# Patient Record
Sex: Male | Born: 1950 | Race: White | Hispanic: No | Smoking: Never smoker
Health system: Southern US, Community
[De-identification: ages and names within clinical notes are randomized; demographics above are authoritative.]

## PROBLEM LIST (undated history)

## (undated) DIAGNOSIS — I10 Essential (primary) hypertension: Secondary | ICD-10-CM

## (undated) DIAGNOSIS — G4730 Sleep apnea, unspecified: Secondary | ICD-10-CM

## (undated) DIAGNOSIS — M1710 Unilateral primary osteoarthritis, unspecified knee: Secondary | ICD-10-CM

## (undated) DIAGNOSIS — E785 Hyperlipidemia, unspecified: Secondary | ICD-10-CM

## (undated) DIAGNOSIS — A6000 Herpesviral infection of urogenital system, unspecified: Secondary | ICD-10-CM

## (undated) DIAGNOSIS — M179 Osteoarthritis of knee, unspecified: Secondary | ICD-10-CM

## (undated) HISTORY — DX: Herpesviral infection of urogenital system, unspecified: A60.00

## (undated) HISTORY — DX: Hyperlipidemia, unspecified: E78.5

## (undated) HISTORY — DX: Osteoarthritis of knee, unspecified: M17.9

## (undated) HISTORY — DX: Unilateral primary osteoarthritis, unspecified knee: M17.10

## (undated) HISTORY — DX: Essential (primary) hypertension: I10

## (undated) HISTORY — DX: Sleep apnea, unspecified: G47.30

---

## 1998-09-10 DIAGNOSIS — G4733 Obstructive sleep apnea (adult) (pediatric): Secondary | ICD-10-CM

## 1998-09-10 HISTORY — DX: Obstructive sleep apnea (adult) (pediatric): G47.33

## 1999-05-21 ENCOUNTER — Ambulatory Visit: Admission: RE | Admit: 1999-05-21 | Discharge: 1999-05-21 | Payer: Self-pay | Admitting: *Deleted

## 2005-09-10 DIAGNOSIS — M47812 Spondylosis without myelopathy or radiculopathy, cervical region: Secondary | ICD-10-CM

## 2005-09-10 HISTORY — PX: CERVICAL SPINE SURGERY: SHX589

## 2005-09-10 HISTORY — DX: Spondylosis without myelopathy or radiculopathy, cervical region: M47.812

## 2006-07-21 ENCOUNTER — Ambulatory Visit: Payer: Self-pay | Admitting: Cardiovascular Disease

## 2006-07-21 ENCOUNTER — Inpatient Hospital Stay (HOSPITAL_COMMUNITY): Admission: EM | Admit: 2006-07-21 | Discharge: 2006-07-23 | Payer: Self-pay | Admitting: Emergency Medicine

## 2006-07-22 ENCOUNTER — Encounter: Payer: Self-pay | Admitting: Cardiology

## 2009-03-02 ENCOUNTER — Ambulatory Visit (HOSPITAL_BASED_OUTPATIENT_CLINIC_OR_DEPARTMENT_OTHER): Admission: RE | Admit: 2009-03-02 | Discharge: 2009-03-02 | Payer: Self-pay | Admitting: Family Medicine

## 2009-03-05 ENCOUNTER — Ambulatory Visit: Payer: Self-pay | Admitting: Internal Medicine

## 2011-01-23 NOTE — Procedures (Signed)
[name], [name]                ACCOUNT NO.:  [id number]   MEDICAL RECORD NO.:  [id number]          PATIENT TYPE:  OUT   LOCATION:  SLEEP CENTER                 FACILITY:  [facility]   PHYSICIAN:  Clinton D. Maple Hudson, MD, FCCP, FACPDATE OF BIRTH:  19-Nov-1951   DATE OF STUDY:  03/02/2009                            NOCTURNAL POLYSOMNOGRAM   REFERRING PHYSICIAN:  Marvis Repress   INDICATION FOR STUDY:  Hypersomnia with sleep apnea.  Epworth sleepiness  score 0/24.  BMI 34.3.  Weight 260 pounds, height 73 inches.  Neck 18.5  inches.  Home medication charted and reviewed.  A baseline diagnostic  NPSG on May 12, 1999, recorded an RDI of 60 per hour with split  protocol CPAP titration to 7 CWP, RDI 0 per hour.  CPAP titration is now  requested.   SLEEP ARCHITECTURE:  Total sleep time 225 minutes with sleep efficiency  58.2%.  Stage I was 21.1%, stage II 68%, stage III absent, REM 10.9% of  total sleep time.  Sleep latency 14.5 minutes, REM latency 206 minutes,  awake after sleep onset 147 minutes, arousal index 26.1.  Sleep  architecture notable for difficulty sustaining sleep until 1:00 a.m.  Prior to that, there were repeated brief sleep intervals, but he was  mostly awake from lights out at 22:35 p.m.  No bedtime medication was  taken.   RESPIRATORY DATA:  CPAP titration protocol.  CPAP was titrated to 11  CWP, AHI 1.6 per hour.  He wore a large ResMed Activa nasal mask with  heated humidifier.   OXYGEN DATA:  Minimal to no snoring with CPAP.  Mean oxygen saturation  on room air with CPAP was 94.3%.   CARDIAC DATA:  Sinus rhythm with occasional PVC.   MOVEMENT/PARASOMNIA:  No significant movement disturbance.  No bathroom  trips.   IMPRESSION/RECOMMENDATIONS:  1. Successful continuous positive airway pressure to 11 CWP, AHI 1.6      per hour.  He wore a large ResMed Activa nasal mask with heated      humidifier.  2. Baseline diagnostic nocturnal polysomnogram on May 12, 1999,      had recorded an RDI of 60 per hour with split study continuous      positive airway pressure to 7 CWP, RDI 0 per hour at that time.      Clinton D. Maple Hudson, MD, [facility], FACP  Diplomate, Biomedical engineer of Sleep Medicine  Electronically Signed    CDY/MEDQ  D:  03/05/2009 12:13:38  T:  03/05/2009 20:53:16  Job:  [id number]

## 2011-01-26 NOTE — H&P (Signed)
Ryan [name], Ryan [name]                ACCOUNT NO.:  [id number]   MEDICAL RECORD NO.:  [id number]          PATIENT TYPE:  EMS   LOCATION:  MAJO                         FACILITY:  [facility]   PHYSICIAN:  Theresia Bough, MD       DATE OF BIRTH:  02/01/1951   DATE OF ADMISSION:  07/20/2006  DATE OF DISCHARGE:                                HISTORY & PHYSICAL   PRIMARY CARE PHYSICIAN:  Patient is not assigned.   PRESENTING COMPLAINT:  Chest pain.   HISTORY OF PRESENT ILLNESS:  This is a 60 year old white male patient who  came into the emergency room because of chest pain.  The pain started about  3 days ago.  The pain started around his neck, left side of the neck.  The  pain refers to his left shoulder, his left chest and his left arm and  forearm.  Patient has been taking ibuprofen for the pain.  The patient is  relieved by ibuprofen.  About sometime this afternoon, the patient developed  nausea and that was why he came to the hospital.  He denies any history of  heart disease.  He denies any headache.  No dizziness.  No blurring of  vision.  He denies cough.  No wheezing.  No hemoptysis.  He has nausea.  He  denies vomiting and he denies diarrhea.  Denies abdominal pain.  He denies  any leg swelling.  There is no foot edema.  The patient denies any dysuria.   PAST MEDICAL HISTORY:  A past medical history of sleep apnea.  The patient  uses CPAP to sleep at home.   SOCIAL HISTORY:  Denies any smoking.  He drinks alcohol socially.   FAMILY HISTORY:  Noncontributory.   MEDICATIONS:  Patient is not on any medications.   ALLERGIES:  HE DENIES ANY KNOWN ALLERGIES.   PHYSICAL EXAMINATION:  VITAL SIGNS:  Blood pressure 171/104.  Pulse rate is  100.  Respirations are 22.  HEENT:  Shows pink conjunctivae.  He has no jaundice.  His mucous membranes  are moist.  NECK:  Supple.  CHEST:  There is no chest wall tenderness.  Auscultation shows clear breath  sounds.  CARDIOVASCULAR:  Shows normal  heart sounds with no murmur and no gallop.  Pulses are palpable in his legs.  ABDOMEN:  Soft, nontender and no masses palpable.  Patient has normal bowel  sounds.  EXTREMITIES:  Show no edema.  No cyanosis.  Patient has normal range of  motion in his joints.  CENTRAL NERVOUS SYSTEM:  Patient is alert and oriented to time place and  person.  There is no focal deficit.  Power is 4/4 in all limbs.  His  sensation is intact.   INITIAL LABORATORIES:  Sodium 136, potassium 4.2, chloride of 103, bicarb of  28, BUN of 10, creatinine of 1.3 and a glucose of 184.  Initial myoglobin is  397, CK-MB was 7.1, troponin I 0.05, D-dimer is negative.  Second myoglobin  is greater than 500, CK-MB is 7.5, troponin is less than 0.,05.  His chest x-  ray is negative.  His EKG shows sinus tachycardia.  There is no diagnostic  ST/T changes.   ASSESSMENT:  1. Chest pain, rule out acute coronary syndrome.  2. History of sleep apnea.   PLAN:  To admit patient to telemetry observation.  I am going to continue  cardiac enzymes protocol.  Patient is begin to have Lopressor 25 mg p.o.  q.12 hours to lower his blood pressure.  He is going to have nitro paste 1/2  inch to skin q.6 hours.  Patient is going to have morphine 1 to 2 mg q.24  hours p.r.n. for pain, Xanax 0.5 mg p.o. q.2 to 4 hours p.r.n. for anxiety.  Patient will also have a 2D echo in the a.m. to assess his left ventricular  functions.  Phenergan 12.5 mg IV q.4 hours p.r.n. for nausea.  Heparin 5000  units subcu q.8 hours for DVT prophylaxis, Protonix 40 mg p.o. daily and an  aspirin 81 mg p.o. daily.  Patient denied any history of high blood  pressure.  His blood pressure might have been elevated because of pain.  However, he is going to receive some Lopressor to control his heart rate, as  well as to lower his blood pressure.      Theresia Bough, MD  Electronically Signed     GA/MEDQ  D:  07/21/2006  T:  07/21/2006  Job:  [id number]

## 2011-01-26 NOTE — Discharge Summary (Signed)
NAMEELIZAR, ALPERN                ACCOUNT NO.:  [id number]   MEDICAL RECORD NO.:  [id number]          PATIENT TYPE:  INP   LOCATION:  3736                         FACILITY:  [facility]   PHYSICIAN:  Hind I Elsaid, MD      DATE OF BIRTH:  Nov 22, 1950   DATE OF ADMISSION:  07/21/2006  DATE OF DISCHARGE:  07/23/2006                               DISCHARGE SUMMARY   DISCHARGE DIAGNOSES:  1. Atypical chest pain.  2. 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  3. History of obstructive sleep apnea on CPAP.  4. Obesity.   DISCHARGE DIAGNOSIS:  Aspirin 81 mg p.o. daily, Protonix 40 mg,  Lopressor 25 mg p.o. q.12h. and Dilaudid 4 mg p.o. q.4-6h., Flexeril 10  mg p.o. q.8h. p.r.n.   CONSULTATIONS:  Cardiology consult for chest pain.   PROCEDURE:  Echocardiogram was done, left ventricular systolic function  was normal with no left ventricular region wall-motion abnormalities,  left ventricular wall thickness was at upper limits of normal.  MRI of  the spine, mild thoracic degenerative change but no acute bony findings.  Small focal central disk protrusion at C7-8.  Minimal disk bulge at C6-  7.  MRI of the thoracic spine, multilevel disk protrusion and a more  significant finding of moderate size left paracentral and foraminal disk  protrusion at C6-7 likely affecting the left C7 nerve root and moderate  size central disk protrusion at C4-5 contracting the cervical cord.   BRIEF HISTORY:  1. A 61 male with no significant history other than the obstructive      sleep apnea, presented with chest pain.  On deep questioning, the      pain mainly at the neck, radiating to the chest and left shoulder,      relieved by ibuprofen, Dilaudid and Dilaudid during      hospitalization.  The patient was admitted to telemetry.  Serial      EKG, CK-MB troponin were ordered and reviewed with no significant      change.  A 2D echo there is no wall-motion abnormalities.      Cardiology was consulted and evaluated chest  pain.  The patient was      arranged to have outpatient exercise Myoview which was scheduled      for July 24, 2006, at 7:30 a.m. at [facility] and he      will be followed with Dr. Gala Romney on August 05, 2006, at 9:15      a.m.  I recommend to continue with the aspirin.  Chest pain most      probably is atypical chest pain due to the above disk protrusion      and thoracic and cervical spine.  The patient will be discharged on      Dilaudid p.o. and Flexeril and to follow with primary care further      evaluation of the disk protrusion if not responding to the pain      medication.  He may need to be evaluated by orthopedic surgery.  2. Hypertension.  The patient will be discharged with small-dose beta  blocker if deemed necessary.  3. Obstructive sleep apnea.  Continue with the CPAP.   DISPOSITION:  The patient is stable to be discharged home today with  cephalexin and Dilaudid.  Follow with cardiology as outpatient for  Myoview stress test and follow with primary care for the neck pain for  further control of the pain.      Hind Bosie Helper, MD  Electronically Signed     HIE/MEDQ  D:  10/25/2006  T:  10/26/2006  Job:  [id number]

## 2011-01-26 NOTE — Consult Note (Signed)
Ryan [name], Ryan [name]                ACCOUNT NO.:  [id number]   MEDICAL RECORD NO.:  [id number]          PATIENT TYPE:  INP   LOCATION:  3736                         FACILITY:  [facility]   PHYSICIAN:  Noralyn Pick. Eden Emms, MD, FACCDATE OF BIRTH:  04-01-1951   DATE OF CONSULTATION:  DATE OF DISCHARGE:                                   CONSULTATION   DATE OF CONSULTATION:  July 22, 2006   REQUESTING PHYSICIAN:  [facility] D   PRIMARY CARE PHYSICIAN:  Patient is new to [facility] being seen by  Dr. Eden Emms   PATIENT PROFILE:  A 60 year old white male without prior cardiac history who  presented with atypical left neck and arm pain.   PROBLEM LIST:  1. Left neck, chest and arm pain.  2. History of obstructive sleep apnea on CPAP.  3. Obesity.   HISTORY OF PRESENT ILLNESS:  A 60 year old white male without prior cardiac  history.  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  He  was in his usual state of health until approximately five days ago when he  awoke with left posterior neck pain and pulling with radiation to the left  shoulder and later with a drawing sensation from his left neck diagonally to  the left belt line with the sensation that his left arm was being pulled  into his body.  Symptoms have been intermittent since about five days ago  and he has been self-medicating with ibuprofen 800 mg q.4.h. which  subsequently caused him to feel nauseated with GI upset.  On Saturday  evening, he had an episode of this left neck, arm and chest pulling and  drawing sensation associated with diaphoresis and nausea which lasted about  an hour.  This prompted him to present to the [facility] ED where he was  noted to be tachycardic in sinus rhythm with a rate of 188 beats per minute  and also hypertensive with pressures in the 160s to 170s.  He was admitted  to the incompass service and his CKs have been elevated with a peak of 1148  with mild elevation of the  CK-MB peak at 7.2, but a normal CK to MB ratio as  well as normal troponin less than .01.  His EKG was without any acute  changes and his last episode of this drawing sensation and spasming as he  describes it occurred last night for about an hour.  He is currently  asymptomatic.   ALLERGIES:  No known drug allergies.   HOME MEDICATIONS:  None.   MEDICATIONS HERE:  1. Heparin 5000 units subcutaneous q.8.h.  2. Aspirin 81 mg q.d.  3. Protonix 40 mg q.d.  4. NitroPaste 0.5 q.6.h.  5. Lopressor 50 mg b.i.d.   FAMILY HISTORY:  Mother died of natural causes and also had a history of  diabetes.  She died at 72.  Father died at age 72 of what the patient  describes as a clot in the aorta which sounds like it caused a massive  stroke.  It is not clear if this could  have been a dissection.  He has two  sisters who are alive and well.   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  He does not otherwise exercise.  He does not smoke cigarettes and has a  couple of drinks about once a month, he denies any drug use.   REVIEW OF SYSTEMS:  Positive for left neck, arm and chest drawing, pulling  and spasm-like sensation, otherwise all other systems reviewed and negative.   PHYSICAL EXAMINATION:  VITAL SIGNS:  Temperature 99.4, heart rate 66,  respirations 20, blood pressure 134/74, pulse ox 96% on room air.  GENERAL:  A pleasant white male in no acute distress, awake, alert and oriented x3.  HEENT:  Atraumatic, normocephalic.  SKIN:  Warm and dry without lesions or masses.  NEURO:  Grossly intact and nonfocal.  NECK:  Normal carotid upstrokes, no bruits or JVD.  LUNGS:  Respirations regular and unlabored, clear to auscultation.  CARDIAC:  Regular S1-S2, no S3-S4 or murmurs.  ABDOMEN:  Round, soft, nontender,  nondistended.  Bowel sounds present x4.  EXTREMITIES:  Warm and dry, pink, no clubbing, cyanosis or edema, dorsalis  pedis, posterior tibial pulses 2+ and equal bilaterally.   Chest x-ray on November 10 showed no acute disease.   EKG on admission showed sinus tach with a normal axis and a rate of 118  beats per minute.   LAB WORK:  Hemoglobin 15.2, hematocrit 43.2, WBC 10.2, platelets 268, sodium  137, potassium 3.7, chloride 101, CO2 26, BUN 9, creatinine 1.2, glucose  148, free T4 0.98, CK 1025, MB 5.6, troponin I less than .01, calcium 9.1.   ASSESSMENT/PLAN:  1. Left neck/arm pain, fairly atypical for cardiac chest pain with      episodes lasting approximately one hour, being relieved with NSAIDs and      leaving him with residual arm soreness.  He does have elevated CKs and      mildly elevated CK-MBs, however the ratio is within normal limits as      are his troponins x4.  The primary team is obtaining an MRI of the C      spine and one would have to question if this patient is experiencing      cervical radiculopathy to explain his symptoms.  We will arrange for an      outpatient exercise Myoview which has been scheduled for Wednesday,      November 14, at 7:30 a.m. at [facility].  He also has a      followup with Dr. Charlton Haws on November 26 at 9:15 a.m.  Would      continue aspirin.  We will add Flexeril 10 mg q.8.h. for what the      patient describes as spasm in his left neck and arm.  2. Hypertension.  Her is currently on b.i.d. beta-blocker.  As he is ruled      out and it seems unlikely that his chest      pain is cardiac in origin, might consider discontinuing beta-blocker in      this relatively young and active guy in favor of hydrochlorothiazide      for blood pressure control if necessary.  3. Obstructive sleep apnea.  Continue CPAP.      Ryan Deer  Brion [name], ANP      Noralyn Pick. Eden Emms, MD, [name]  Electronically Signed   CB/MEDQ  D:  07/22/2006   T:  07/22/2006  Job:  [id number]

## 2013-06-23 DIAGNOSIS — E119 Type 2 diabetes mellitus without complications: Secondary | ICD-10-CM | POA: Insufficient documentation

## 2017-10-15 DIAGNOSIS — H5213 Myopia, bilateral: Secondary | ICD-10-CM | POA: Diagnosis not present

## 2017-10-15 DIAGNOSIS — H52223 Regular astigmatism, bilateral: Secondary | ICD-10-CM | POA: Diagnosis not present

## 2018-01-30 DIAGNOSIS — Z760 Encounter for issue of repeat prescription: Secondary | ICD-10-CM | POA: Diagnosis not present

## 2018-01-30 DIAGNOSIS — A609 Anogenital herpesviral infection, unspecified: Secondary | ICD-10-CM | POA: Diagnosis not present

## 2018-05-09 DIAGNOSIS — M1990 Unspecified osteoarthritis, unspecified site: Secondary | ICD-10-CM | POA: Diagnosis not present

## 2018-05-21 DIAGNOSIS — M25661 Stiffness of right knee, not elsewhere classified: Secondary | ICD-10-CM | POA: Diagnosis not present

## 2018-05-21 DIAGNOSIS — M170 Bilateral primary osteoarthritis of knee: Secondary | ICD-10-CM | POA: Diagnosis not present

## 2018-05-21 DIAGNOSIS — M25662 Stiffness of left knee, not elsewhere classified: Secondary | ICD-10-CM | POA: Diagnosis not present

## 2018-05-22 DIAGNOSIS — M25661 Stiffness of right knee, not elsewhere classified: Secondary | ICD-10-CM | POA: Diagnosis not present

## 2018-05-22 DIAGNOSIS — M25662 Stiffness of left knee, not elsewhere classified: Secondary | ICD-10-CM | POA: Diagnosis not present

## 2018-05-22 DIAGNOSIS — M170 Bilateral primary osteoarthritis of knee: Secondary | ICD-10-CM | POA: Diagnosis not present

## 2018-05-27 DIAGNOSIS — M25662 Stiffness of left knee, not elsewhere classified: Secondary | ICD-10-CM | POA: Diagnosis not present

## 2018-05-27 DIAGNOSIS — M170 Bilateral primary osteoarthritis of knee: Secondary | ICD-10-CM | POA: Diagnosis not present

## 2018-05-27 DIAGNOSIS — M25661 Stiffness of right knee, not elsewhere classified: Secondary | ICD-10-CM | POA: Diagnosis not present

## 2018-05-29 DIAGNOSIS — M25661 Stiffness of right knee, not elsewhere classified: Secondary | ICD-10-CM | POA: Diagnosis not present

## 2018-05-29 DIAGNOSIS — M25662 Stiffness of left knee, not elsewhere classified: Secondary | ICD-10-CM | POA: Diagnosis not present

## 2018-06-03 DIAGNOSIS — M25661 Stiffness of right knee, not elsewhere classified: Secondary | ICD-10-CM | POA: Diagnosis not present

## 2018-06-03 DIAGNOSIS — M25662 Stiffness of left knee, not elsewhere classified: Secondary | ICD-10-CM | POA: Diagnosis not present

## 2018-06-03 DIAGNOSIS — M170 Bilateral primary osteoarthritis of knee: Secondary | ICD-10-CM | POA: Diagnosis not present

## 2018-06-05 DIAGNOSIS — M25662 Stiffness of left knee, not elsewhere classified: Secondary | ICD-10-CM | POA: Diagnosis not present

## 2018-06-05 DIAGNOSIS — M25661 Stiffness of right knee, not elsewhere classified: Secondary | ICD-10-CM | POA: Diagnosis not present

## 2018-06-05 DIAGNOSIS — M170 Bilateral primary osteoarthritis of knee: Secondary | ICD-10-CM | POA: Diagnosis not present

## 2018-06-09 DIAGNOSIS — M25662 Stiffness of left knee, not elsewhere classified: Secondary | ICD-10-CM | POA: Diagnosis not present

## 2018-06-09 DIAGNOSIS — M170 Bilateral primary osteoarthritis of knee: Secondary | ICD-10-CM | POA: Diagnosis not present

## 2018-06-09 DIAGNOSIS — M25661 Stiffness of right knee, not elsewhere classified: Secondary | ICD-10-CM | POA: Diagnosis not present

## 2018-06-16 DIAGNOSIS — M25661 Stiffness of right knee, not elsewhere classified: Secondary | ICD-10-CM | POA: Diagnosis not present

## 2018-06-16 DIAGNOSIS — M170 Bilateral primary osteoarthritis of knee: Secondary | ICD-10-CM | POA: Diagnosis not present

## 2018-06-16 DIAGNOSIS — M25662 Stiffness of left knee, not elsewhere classified: Secondary | ICD-10-CM | POA: Diagnosis not present

## 2019-02-19 DIAGNOSIS — A6000 Herpesviral infection of urogenital system, unspecified: Secondary | ICD-10-CM | POA: Diagnosis not present

## 2019-02-19 DIAGNOSIS — Z760 Encounter for issue of repeat prescription: Secondary | ICD-10-CM | POA: Diagnosis not present

## 2019-02-19 DIAGNOSIS — R030 Elevated blood-pressure reading, without diagnosis of hypertension: Secondary | ICD-10-CM | POA: Diagnosis not present

## 2019-02-20 ENCOUNTER — Other Ambulatory Visit: Payer: Self-pay

## 2019-02-20 NOTE — Patient Outreach (Signed)
[facility]) Care Management  02/20/2019  Ryan [name] [birth year] [id number]   Telephone Screen  Referral Date: 02/20/2019 Referral Source: Nurse Call Center Referral Reason: " 02/20/2019-10:30am-caller states he would like another machine, but he needs a script for it" Insurance: HTA   Outreach attempt #1 to patient. Spoke with patient who voices that he would like to get a new CPAP machine. The one he has is several years old and he has seen on TV the newer models and would like one of those. Patient aware that he needs script for DME. He states that his PCP just retired. He has list of providers to find new PCP but has not selected one yet. Office where PCP was in has other physicians in there but he is unsure if he wants to stick with practice as they are not in network. He does not see a specialist for condition. RN CM discussed with patient his options regarding obtaining PCP and he is aware of how to go about the process. He states that he will stick with PCP office for now as this is the easiest and quickest solution so he can get CPAP. He reports he will call office on Monday to advise them that he wants to stay with practice, obtain new MD and get script. He denies any further RN CM needs or concerns at this time. Patient advised to feel free to call 24hr Nurse Line for any future needs or concerns. He voiced understanding and was appreciative of follow up call.    Plan: RN CM will close case as no further interventions needed at this time.  Enzo Montgomery, RN,BSN,CCM [place] Management Telephonic Care Management Coordinator Direct Phone: [phone] Toll Free: [phone] Fax: [phone]

## 2019-07-27 DIAGNOSIS — K047 Periapical abscess without sinus: Secondary | ICD-10-CM | POA: Diagnosis not present

## 2019-09-11 DIAGNOSIS — U071 COVID-19: Secondary | ICD-10-CM

## 2019-09-11 HISTORY — DX: COVID-19: U07.1

## 2019-09-23 DIAGNOSIS — U071 COVID-19: Secondary | ICD-10-CM | POA: Diagnosis not present

## 2019-09-23 DIAGNOSIS — R231 Pallor: Secondary | ICD-10-CM | POA: Diagnosis not present

## 2019-09-23 DIAGNOSIS — R519 Headache, unspecified: Secondary | ICD-10-CM | POA: Diagnosis not present

## 2019-09-23 DIAGNOSIS — R6883 Chills (without fever): Secondary | ICD-10-CM | POA: Diagnosis not present

## 2019-09-23 DIAGNOSIS — R110 Nausea: Secondary | ICD-10-CM | POA: Diagnosis not present

## 2019-11-02 DIAGNOSIS — M1712 Unilateral primary osteoarthritis, left knee: Secondary | ICD-10-CM | POA: Diagnosis not present

## 2019-11-02 DIAGNOSIS — M1711 Unilateral primary osteoarthritis, right knee: Secondary | ICD-10-CM | POA: Diagnosis not present

## 2019-11-02 DIAGNOSIS — M170 Bilateral primary osteoarthritis of knee: Secondary | ICD-10-CM | POA: Diagnosis not present

## 2019-11-03 DIAGNOSIS — M1711 Unilateral primary osteoarthritis, right knee: Secondary | ICD-10-CM | POA: Diagnosis not present

## 2019-11-03 DIAGNOSIS — M1712 Unilateral primary osteoarthritis, left knee: Secondary | ICD-10-CM | POA: Diagnosis not present

## 2019-11-09 DIAGNOSIS — M1712 Unilateral primary osteoarthritis, left knee: Secondary | ICD-10-CM | POA: Diagnosis not present

## 2019-11-09 DIAGNOSIS — M1711 Unilateral primary osteoarthritis, right knee: Secondary | ICD-10-CM | POA: Diagnosis not present

## 2019-11-12 DIAGNOSIS — M1711 Unilateral primary osteoarthritis, right knee: Secondary | ICD-10-CM | POA: Diagnosis not present

## 2019-11-12 DIAGNOSIS — M1712 Unilateral primary osteoarthritis, left knee: Secondary | ICD-10-CM | POA: Diagnosis not present

## 2019-11-17 DIAGNOSIS — M1711 Unilateral primary osteoarthritis, right knee: Secondary | ICD-10-CM | POA: Diagnosis not present

## 2019-11-17 DIAGNOSIS — M1712 Unilateral primary osteoarthritis, left knee: Secondary | ICD-10-CM | POA: Diagnosis not present

## 2019-11-19 DIAGNOSIS — M1711 Unilateral primary osteoarthritis, right knee: Secondary | ICD-10-CM | POA: Diagnosis not present

## 2019-11-19 DIAGNOSIS — M1712 Unilateral primary osteoarthritis, left knee: Secondary | ICD-10-CM | POA: Diagnosis not present

## 2019-11-24 DIAGNOSIS — M1712 Unilateral primary osteoarthritis, left knee: Secondary | ICD-10-CM | POA: Diagnosis not present

## 2019-11-24 DIAGNOSIS — M1711 Unilateral primary osteoarthritis, right knee: Secondary | ICD-10-CM | POA: Diagnosis not present

## 2019-11-26 DIAGNOSIS — M1712 Unilateral primary osteoarthritis, left knee: Secondary | ICD-10-CM | POA: Diagnosis not present

## 2019-11-26 DIAGNOSIS — M1711 Unilateral primary osteoarthritis, right knee: Secondary | ICD-10-CM | POA: Diagnosis not present

## 2019-11-30 DIAGNOSIS — M1711 Unilateral primary osteoarthritis, right knee: Secondary | ICD-10-CM | POA: Diagnosis not present

## 2019-11-30 DIAGNOSIS — M1712 Unilateral primary osteoarthritis, left knee: Secondary | ICD-10-CM | POA: Diagnosis not present

## 2020-03-10 DIAGNOSIS — IMO0002 Reserved for concepts with insufficient information to code with codable children: Secondary | ICD-10-CM

## 2020-03-10 DIAGNOSIS — E1165 Type 2 diabetes mellitus with hyperglycemia: Secondary | ICD-10-CM

## 2020-03-10 DIAGNOSIS — R918 Other nonspecific abnormal finding of lung field: Secondary | ICD-10-CM

## 2020-03-10 HISTORY — DX: Other nonspecific abnormal finding of lung field: R91.8

## 2020-03-10 HISTORY — DX: Reserved for concepts with insufficient information to code with codable children: IMO0002

## 2020-03-10 HISTORY — DX: Type 2 diabetes mellitus with hyperglycemia: E11.65

## 2020-04-02 DIAGNOSIS — Z79899 Other long term (current) drug therapy: Secondary | ICD-10-CM | POA: Diagnosis not present

## 2020-04-02 DIAGNOSIS — I2510 Atherosclerotic heart disease of native coronary artery without angina pectoris: Secondary | ICD-10-CM

## 2020-04-02 DIAGNOSIS — I214 Non-ST elevation (NSTEMI) myocardial infarction: Secondary | ICD-10-CM | POA: Diagnosis not present

## 2020-04-02 DIAGNOSIS — E871 Hypo-osmolality and hyponatremia: Secondary | ICD-10-CM | POA: Diagnosis not present

## 2020-04-02 DIAGNOSIS — E785 Hyperlipidemia, unspecified: Secondary | ICD-10-CM | POA: Diagnosis not present

## 2020-04-02 DIAGNOSIS — I25110 Atherosclerotic heart disease of native coronary artery with unstable angina pectoris: Secondary | ICD-10-CM | POA: Diagnosis not present

## 2020-04-02 DIAGNOSIS — I340 Nonrheumatic mitral (valve) insufficiency: Secondary | ICD-10-CM | POA: Diagnosis not present

## 2020-04-02 DIAGNOSIS — Z7982 Long term (current) use of aspirin: Secondary | ICD-10-CM | POA: Diagnosis not present

## 2020-04-02 DIAGNOSIS — R079 Chest pain, unspecified: Secondary | ICD-10-CM | POA: Diagnosis not present

## 2020-04-02 DIAGNOSIS — E669 Obesity, unspecified: Secondary | ICD-10-CM | POA: Diagnosis not present

## 2020-04-02 DIAGNOSIS — I2582 Chronic total occlusion of coronary artery: Secondary | ICD-10-CM | POA: Diagnosis not present

## 2020-04-02 DIAGNOSIS — R000 Tachycardia, unspecified: Secondary | ICD-10-CM | POA: Diagnosis not present

## 2020-04-02 DIAGNOSIS — E1165 Type 2 diabetes mellitus with hyperglycemia: Secondary | ICD-10-CM | POA: Diagnosis not present

## 2020-04-02 DIAGNOSIS — Z6831 Body mass index (BMI) 31.0-31.9, adult: Secondary | ICD-10-CM | POA: Diagnosis not present

## 2020-04-02 DIAGNOSIS — Z8616 Personal history of COVID-19: Secondary | ICD-10-CM | POA: Diagnosis not present

## 2020-04-02 DIAGNOSIS — A6000 Herpesviral infection of urogenital system, unspecified: Secondary | ICD-10-CM | POA: Diagnosis not present

## 2020-04-02 DIAGNOSIS — R918 Other nonspecific abnormal finding of lung field: Secondary | ICD-10-CM | POA: Diagnosis not present

## 2020-04-02 DIAGNOSIS — I10 Essential (primary) hypertension: Secondary | ICD-10-CM | POA: Diagnosis not present

## 2020-04-02 DIAGNOSIS — R911 Solitary pulmonary nodule: Secondary | ICD-10-CM | POA: Diagnosis not present

## 2020-04-02 HISTORY — DX: Atherosclerotic heart disease of native coronary artery without angina pectoris: I25.10

## 2020-04-03 HISTORY — PX: TRANSTHORACIC ECHOCARDIOGRAM: SHX275

## 2020-04-04 HISTORY — PX: LEFT HEART CATH AND CORONARY ANGIOGRAPHY: CATH118249

## 2020-04-19 ENCOUNTER — Encounter (INDEPENDENT_AMBULATORY_CARE_PROVIDER_SITE_OTHER): Payer: Self-pay

## 2020-05-05 ENCOUNTER — Ambulatory Visit (INDEPENDENT_AMBULATORY_CARE_PROVIDER_SITE_OTHER): Payer: PPO | Admitting: Cardiology

## 2020-05-05 ENCOUNTER — Other Ambulatory Visit: Payer: Self-pay

## 2020-05-05 VITALS — BP 138/84 | HR 59 | Ht 73.0 in | Wt 235.8 lb

## 2020-05-05 DIAGNOSIS — I214 Non-ST elevation (NSTEMI) myocardial infarction: Secondary | ICD-10-CM | POA: Insufficient documentation

## 2020-05-05 DIAGNOSIS — E785 Hyperlipidemia, unspecified: Secondary | ICD-10-CM

## 2020-05-05 DIAGNOSIS — E119 Type 2 diabetes mellitus without complications: Secondary | ICD-10-CM | POA: Diagnosis not present

## 2020-05-05 DIAGNOSIS — R5383 Other fatigue: Secondary | ICD-10-CM | POA: Diagnosis not present

## 2020-05-05 DIAGNOSIS — E118 Type 2 diabetes mellitus with unspecified complications: Secondary | ICD-10-CM

## 2020-05-05 DIAGNOSIS — I25110 Atherosclerotic heart disease of native coronary artery with unstable angina pectoris: Secondary | ICD-10-CM

## 2020-05-05 DIAGNOSIS — H0100A Unspecified blepharitis right eye, upper and lower eyelids: Secondary | ICD-10-CM | POA: Diagnosis not present

## 2020-05-05 DIAGNOSIS — H25813 Combined forms of age-related cataract, bilateral: Secondary | ICD-10-CM | POA: Diagnosis not present

## 2020-05-05 DIAGNOSIS — I25119 Atherosclerotic heart disease of native coronary artery with unspecified angina pectoris: Secondary | ICD-10-CM | POA: Insufficient documentation

## 2020-05-05 DIAGNOSIS — H0100B Unspecified blepharitis left eye, upper and lower eyelids: Secondary | ICD-10-CM | POA: Diagnosis not present

## 2020-05-05 LAB — HM DIABETES EYE EXAM

## 2020-05-05 MED ORDER — METOPROLOL TARTRATE 25 MG PO TABS: 12.5000 mg | ORAL_TABLET | Freq: Two times a day (BID) | ORAL | 11 refills | Status: DC

## 2020-05-05 MED ORDER — LISINOPRIL 5 MG PO TABS: 2.5000 mg | ORAL_TABLET | Freq: Every day | ORAL | 11 refills | Status: DC

## 2020-05-05 MED ORDER — TICAGRELOR 90 MG PO TABS: 90.0000 mg | ORAL_TABLET | Freq: Two times a day (BID) | ORAL | 11 refills | Status: DC

## 2020-05-05 NOTE — Patient Instructions (Addendum)
Medication Instructions:  Start taking BRILINTA 90 mg one tablet twice a daily   the first dose take 2 tablets  Then go to one tablet twice a daily ( morning and night)    change ine Metoprolol tartrate take 12.5 mg ( 1/2 tablet of 25 mg ) twice a day    decrease-- Start taking Lisinopril 2.5 mg  ( 1/2 tablet of 5 mg ) at bedtime  Atorvastatin 80 mg at bedtime   *If you need a refill on your cardiac medications before your next appointment, please call your pharmacy*   Lab Work: Not needed    Testing/Procedures: Not needed   Follow-Up: At [facility], you and your health needs are our priority.  As part of our continuing mission to provide you with exceptional heart care, we have created designated Provider Care Teams.  These Care Teams include your primary Cardiologist (physician) and Advanced Practice Providers (APPs -  Physician Assistants and Nurse Practitioners) who all work together to provide you with the care you need, when you need it.  We recommend signing up for the patient portal called "MyChart".  Sign up information is provided on this After Visit Summary.  MyChart is used to connect with patients for Virtual Visits (Telemedicine).  Patients are able to view lab/test results, encounter notes, upcoming appointments, etc.  Non-urgent messages can be sent to your provider as well.   To learn more about what you can do with MyChart, go to [email or web address].    Your next appointment:   1 month(s)  The format for your next appointment:   Virtual Visit   Provider:   Bryan Lemma, MD   Other Instructions Will obtain your cath and echo film from [facility]

## 2020-05-05 NOTE — Progress Notes (Signed)
Primary Care Provider: Patient, No Pcp Per Cardiologist: Bryan Lemma, MD Electrophysiologist: None   Admitted for NSTEMI - Cath by Dr. Lavena Bullion, d/c by Dr. Juleen Starr [facility] - [facility])  Clinic Note: Chief Complaint  Patient presents with  . Hospitalization Follow-up    Ryan [name] self-referral) recent non-STEMI at [facility]  . Coronary Artery Disease    Cardiac Cath for non-STEMI showed LAD and D1 disease with CTO of LCx (unable to cross) -> patient opted to seek second opinion  . Diabetes    New diagnosis of DM-2, A1c 10.4  . Hyperlipidemia    New diagnosis   HPI:    Ryan [name] is a 69 y.o. male with a PMH notable for recent Non-STEMI April 04, 2020 (Cardiac cath with three-vessel disease currently on medical therapy) and new diagnosis of DM-2 who presents today for establishing cardiology care, transferring from [facility].  Ryan [name] was discharged from [facility] on medical management per his request.  He is now transferring care to [facility] partly due to location, insurance issues and feeling as though he did not understand what was happening when he went for his non-STEMI.  Recent Hospitalizations:   July 24-27, 2021: Initially presented to [facility] with left-sided chest pain/pressure occurring at rest.  Had previously had an episode on 03/29/2020 associated some nausea and mild biliary emesis.  That resolved after , but when the symptoms recurred and woke him up from sleep on 24th he had similar chest discomfort 5/10 in severity without radiation but associated nausea and static biliary emesis.  He first went back to bed but symptoms continued so he drove to the [facility] ER  He was evaluated with a coronary CTA and actually ruled in for non-STEMI.  Was transferred to [facility].  Also diagnosed with DM-2 (A1c 10.3.  Started on  glipizide.  Cardiac cath on Monday, 04/05/2020 revealing occluded LCx, unable to cross with wire also with LAD and diagonal disease.  Was given the option of CABG versus PCI.  Became overwhelmed with these diagnosis, and indicated that he needed to leave because he had business to take care of. ->  He opted medical management for now.  Was discharged on aspirin, statin, lisinopril at low-dose along with Imdur 30 mg and 25 mg twice daily metoprolol which he is only taking once daily as opposed to twice daily.  Reviewed  CV studies:    The following studies were reviewed today: (if available, images/films reviewed: From Epic Chart or Care Everywhere)--at [facility] . CTA-PE protocol (04/02/2020): No PE.  Lung nodules measuring up to 3 mm noted. . Echo (04/03/2020): Normal LV size and thickness.  EF 55 to 60%.  Possible mid apical lateral HK.  Not well visualized RV.  Otherwise normal valves.  Cardiac Cath (04/04/2020) Lucrezia Starch, MD; [facility]  o LM-normal,  - LAD ~70% eccentric, (small-mod) D1 60-70%. - LCx (non-dominant) -- midCx CTO with late filling of OM via L-L & R-L collaterals o Large-Dom RCA - mild ectasia with diffuse irregularities -< PDA & PRAV-PL mild to mod irregularities.   o EF 50-55% w/ anterolateral-apical HK. LVEDP ~10 mmHg.  o Options - PCI LAD & Diag with possible CTO PCI of LCx vs. 2 V CABG   Interval History:   Ryan [name] now presents to transfer her cardiology care to [facility] in [place] partially for second opinion and partially  because of insurance issues.  He has not had any further chest pain episodes since his discharge.  He did go on a trip up to [place] with his friend for a car show.  He indicated that on the weeks leading up to his MI, he was under a lot of stress with the divorce.  He was moving a lot of heavy boxes.  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  He was  moving all of his equipment to his new home.  He did indicate that several days prior to going to the ER he had had that episode of chest pain that he sort of "blew off ".  Since his discharge, he really has not been doing very much as far as activity goes.  He says his blood pressures at home are usually if any little low with a low being 92/50 and average being about 120/60 mmHg.  He has had some dizziness associated with his.  Is been very very anxious about what he was told.  He was not able to conceptualize at all what information he was given.  He indicates that he has been scared to do much activity but he is walking about 30 minutes at a time and has not had any further chest pain.  He is not walking hardly doing any physical exertion.  No heart failure symptoms of PND, orthopnea edema and no palpitations.  CV Review of Symptoms (Summary) Cardiovascular ROS: positive for - Recent non-STEMI but no further chest pain or pressure. negative for - chest pain, dyspnea on exertion, edema, irregular heartbeat, orthopnea, palpitations, paroxysmal nocturnal dyspnea, rapid heart rate, shortness of breath or Syncope/near syncope and TIA/amaurosis fugax, claudication  The patient does not have symptoms concerning for COVID-19 infection (fever, chills, cough, or new shortness of breath).  The patient is practicing social distancing & Masking.   REVIEWED OF SYSTEMS   Review of Systems  Constitutional: Negative for malaise/fatigue (As felt a little tired, but not sure how he supposed to feel..  Easily feels a little lethargy and fatigue in the morning, but usually better by the evening.) and weight loss.  HENT: Negative for congestion and nosebleeds.   Respiratory: Negative for cough and shortness of breath.   Gastrointestinal: Negative for abdominal pain, blood in stool and melena.       No GI issues since the nausea he he had with his MI.  Genitourinary: Negative for hematuria.  Musculoskeletal:  Positive for joint pain (Both knees).  Neurological: Positive for tingling (He still has some intermittent left arm numbness from his C-spine surgery.). Negative for dizziness, focal weakness, weakness and headaches.  Psychiatric/Behavioral: Negative for depression. The patient is nervous/anxious.    I have reviewed and (if needed) personally updated the patient's problem list, medications, allergies, past medical and surgical history, social and family history.   PAST MEDICAL HISTORY   Past Medical History:  Diagnosis Date  . Cervical spine arthritis 2007   C6-7 hemidiscectomy  . Coronary artery disease 04/02/2020   Cardiac cath on 04/04/2020 for non-STEMI: mLAD ~70% & small-mod D1 60-65%, LCx CTO with L-L & R-L collateral filling OM (unable to cross); Large Dom RCA (ectatic with diffuse mild irregularties) --< RPDA & PRAV-PL mild-mod irregulatities. EF 50-55%, anterolateral-apical HK. EDP 10 mmHg   . COVID-19 virus infection 09/2019  . 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  Marland Kitchen  DJD (degenerative joint disease) of knee    Bilateral knees.  . Genital herpes    Has standing dose of Valtrex  . Hyperlipidemia   . Hypertension   . OSA on CPAP 2000  . Pulmonary nodules 03/2020   CTA-PE protocol (04/02/2020): No PE.  Lung nodules measuring up to 3 mm noted.    PAST SURGICAL HISTORY   Past Surgical History:  Procedure Laterality Date  . CERVICAL SPINE SURGERY  2007   C6-7 hemidiscectomy  . LEFT HEART CATH AND CORONARY ANGIOGRAPHY  04/04/2020   Lucrezia Starch, MD; [facility]: mLAD ~70% & (small-moderate) D1 60 to 70%; nondominant LCx mid vessel CTO prior to OM (OM fills via L-L and R-L collaterals); large-dominant RCA with mild ectasia and diffuse irregularities.  Bifurcates into RPDA and RPL V-PL with mild to moderate irregularities.  EF 50 to 55%.  Anterolateral-apical HK.   LVEDP 10 mmHg.  Marland Kitchen TRANSTHORACIC ECHOCARDIOGRAM  04/03/2020    [facility]) normal LV size and thickness.  EF 55 to 60%.  Possible mid apical lateral HK.  Not well visualized RV.  Otherwise normal valves.   MEDICATIONS/ALLERGIES   Current Meds  Medication Sig  . aspirin 81 MG EC tablet Take 81 mg by mouth.  Marland Kitchen atorvastatin (LIPITOR) 80 MG tablet Take 80 mg by mouth.  . cetirizine (ZYRTEC) 10 MG tablet Take by mouth.  Marland Kitchen glipiZIDE (GLUCOTROL XL) 10 MG 24 hr tablet Take 10 mg by mouth daily.  . isosorbide mononitrate (IMDUR) 30 MG 24 hr tablet Take 30 mg by mouth daily.  Marland Kitchen lisinopril (ZESTRIL) 5 MG tablet Take 0.5 tablets (2.5 mg total) by mouth daily.  . metoprolol tartrate (LOPRESSOR) 25 MG tablet Take 0.5 tablets (12.5 mg total) by mouth 2 (two) times daily.  . [DISCONTINUED] lisinopril (ZESTRIL) 5 MG tablet Take 5 mg by mouth daily.  . [DISCONTINUED] metoprolol tartrate (LOPRESSOR) 25 MG tablet Take 25 mg by mouth 2 (two) times daily.    Not on File  SOCIAL HISTORY/FAMILY HISTORY   Social History   Tobacco Use  . Smoking status: Never Smoker  . Smokeless tobacco: Never Used  Substance Use Topics  . Alcohol use: Yes    Alcohol/week: 3.0 standard drinks    Types: 3 Standard drinks or equivalent per week    Comment: Occasional social  . Drug use: Never   Social History   Social History Narrative   He is currently completing a long very stressful and somewhat mean-spirited divorce.  Has been under a lot of stress with transition of property etc.  He is just wanting to make a full clean break.      Prior to his MI, he was very active walking 30 minutes most days of the week.   Family History  Problem Relation Age of Onset  . Diabetes Father   . Diabetes Other        Several people in the family have diabetes, but no noted CAD.    OBJCTIVE -PE, EKG, labs   Wt Readings from Last 3 Encounters:  05/05/20 235 lb 12.8 oz (107 kg)     Physical Exam: BP 138/84   Pulse (!) 59   Ht  (1.854 m)   Wt 235 lb 12.8 oz (107 kg)   SpO2 99%   BMI 31.11 kg/m  Physical Exam Vitals reviewed.  Constitutional:      General: He is not in acute distress.  Appearance: Normal appearance. He is obese. He is not ill-appearing.     Comments: Healthy-appearing, well-groomed  HENT:     Head: Normocephalic and atraumatic.  Neck:     Vascular: No carotid bruit.     Comments: No JVD or HJR. Cardiovascular:     Rate and Rhythm: Normal rate and regular rhythm.  No extrasystoles are present.    Chest Wall: PMI is not displaced.     Pulses: Normal pulses.     Heart sounds: S1 normal and S2 normal. Heart sounds are distant. No murmur heard.  No friction rub. No gallop.   Pulmonary:     Effort: Pulmonary effort is normal. No respiratory distress.     Breath sounds: Normal breath sounds.  Abdominal:     General: Abdomen is flat. Bowel sounds are normal. There is no distension.     Palpations: Abdomen is soft.     Comments: Obese.  Unable to assess HSM  Musculoskeletal:        General: No swelling. Normal range of motion.     Cervical back: Normal range of motion.  Neurological:     General: No focal deficit present.     Mental Status: He is alert and oriented to person, place, and time.  Psychiatric:        Mood and Affect: Mood normal.        Behavior: Behavior normal.        Thought Content: Thought content normal.        Judgment: Judgment normal.     Comments: He is quite anxious.  Very meticulous with notes and questions already typed out in large-bold print     Adult ECG Report  Rate: 59 ;  Rhythm: normal sinus rhythm and Nonspecific ST and T wave changes..  Otherwise normal axis intervals durations.;   Narrative Interpretation: No prior EKG  Recent Labs: Labs reviewed in Care Everywhere 04/03/2020: Hemoglobin A1c 10.3; Max troponin 749  TC 206, TG 398, HDL 31, LDL 95 (7/24)Na+ 133, K+ 4.4, Cl- 95, HCO3- 24 ,  BUN 15, Cr 1.04, Glu 403, Ca2+ 10.4; AST 44, ALT 59, AlkP 60 04/05/2020: CBC: W 15.1, H/H 16.0/46.1, Plt 232  No results found for: CHOL, HDL, LDLCALC, LDLDIRECT, TRIG, CHOLHDL No results found for: CREATININE, BUN, NA, K, CL, CO2 No results found for: TSH  ASSESSMENT/PLAN    Problem List Items Addressed This Visit    Coronary artery disease involving native coronary artery of native heart with unstable angina pectoris (HCC) - Primary (Chronic)    As mentioned, three-vessel disease noted.  They were not able to cross the LCx-OM lesion and therefore consulted CVTS.  Overwhelming diagnosis caused him to choose medical management at that point and seeking a second opinion.  I will need to review his cath films to know what the best option would be.  It sounds as though it would not be unreasonable to consider PCI of the LAD and possibly diagonal.  We could potentially consider CTO PCI of the LCx as well.  However need to see how diseased the LAD is before we can give this is an option.  We will send a request for CD of his cath and echo films.  (Able to review imaging is much preferred over reports)  Plan: Optimize medical management for now and await outside records to review.  We will see him back to discuss options.  Initiate Brilinta post non-STEMI: 180 mg x 1 followed by 90 mg  twice daily along with aspirin  He is not taking metoprolol correctly, with some fatigue and baseline bradycardia, we will simply convert to 12 and half milligrams twice daily since he is taking 25 mg once a day now.  Continue ACE inhibitor but reduced dose to 2.5 mg and take at bedtime.  Continue statin  Once we know how we will revascularize, we can consider cardiac rehab consult.  Needs aggressive diabetes management.  For now we will simply continue with glipizide however needs to be started on adamant on Metformin and consider SGLT2 inhibitor.      Relevant Medications   aspirin 81 MG EC tablet    atorvastatin (LIPITOR) 80 MG tablet   isosorbide mononitrate (IMDUR) 30 MG 24 hr tablet   lisinopril (ZESTRIL) 5 MG tablet   metoprolol tartrate (LOPRESSOR) 25 MG tablet   Non-ST elevation (NSTEMI) myocardial infarction (HCC) (Chronic)    He presented with chest pain and has been past pain-free since his admission.  He does have moderate disease in LAD and diagonal branch and occluded LCx.  Unfortunately do not have cath films.  He is doing well on medical management however not necessarily take medicines correctly.  He does not seem interested in the concept of CABG--however we need to review the films.  For now I would like to protect him post MI.  Plan: We will start Brilinta 180 mg x 1, followed by 90 mg twice daily going forward.       Relevant Medications   aspirin 81 MG EC tablet   atorvastatin (LIPITOR) 80 MG tablet   isosorbide mononitrate (IMDUR) 30 MG 24 hr tablet   lisinopril (ZESTRIL) 5 MG tablet   metoprolol tartrate (LOPRESSOR) 25 MG tablet   Hyperlipidemia with target LDL less than 70 (Chronic)    LDL in the setting of MI was 85 -> will need to reassess when he is convalescing.  For now he is on high-dose high intensity statin in the form of atorvastatin 80 mg.  Will be due for follow-up labs in roughly October.  This can be ordered after his next follow-up.      Relevant Medications   aspirin 81 MG EC tablet   atorvastatin (LIPITOR) 80 MG tablet   isosorbide mononitrate (IMDUR) 30 MG 24 hr tablet   lisinopril (ZESTRIL) 5 MG tablet   metoprolol tartrate (LOPRESSOR) 25 MG tablet   Type 2 diabetes mellitus with complication, without long-term current use of insulin (HCC) (Chronic)    Pretty poorly controlled diabetes.  Likely related to some dietary indiscretion leading to obesity.  Also significant deconditioning.  Was started on glipizide.  He needs to establish PCP to treat correctly.  He will [facility] to [facility].  For now continue  glipizide, but will likely start Metformin plus or minus SGLT2 inhibitor on follow-up      Relevant Medications   aspirin 81 MG EC tablet   atorvastatin (LIPITOR) 80 MG tablet   glipiZIDE (GLUCOTROL XL) 10 MG 24 hr tablet   lisinopril (ZESTRIL) 5 MG tablet   Fatigue due to treatment    He is feeling fatigued after taking his beta-blocker.  He was not taking it twice daily.  My plan for now will be to reduce to 1/2 tablet twice daily.  May titrate further. I also asked him to take his ACE inhibitor at nighttime.          COVID-19 Education: The signs and symptoms of COVID-19  were discussed with the patient and how to seek care for testing (follow up with PCP or arrange E-visit).   The importance of social distancing and COVID-19 vaccination was discussed today.  I spent a total of with the patient in direct patient consultation.  Additional time spent with chart review  / charting (studies, outside notes, etc): 40 -> I reviewed his H&P and discharge summary as well as cath and echo reports.  We have contacted [facility] to obtain outside films for review. Total Time: 85 min   Current medicines are reviewed at length with the patient today.  (+/- concerns) n/a  Notice: This dictation was prepared with Dragon dictation along with smaller phrase technology. Any transcriptional errors that result from this process are unintentional and may not be corrected upon review.  Patient Instructions / Medication Changes & Studies & Tests Ordered   Patient Instructions  Medication Instructions:  Start taking BRILINTA 90 mg one tablet twice a daily   the first dose take 2 tablets  Then go to one tablet twice a daily ( morning and night)    change ine Metoprolol tartrate take 12.5 mg ( 1/2 tablet of 25 mg ) twice a day    decrease-- Start taking Lisinopril 2.5 mg  ( 1/2 tablet of 5 mg ) at bedtime  Atorvastatin 80 mg at bedtime   *If you need a refill on your cardiac  medications before your next appointment, please call your pharmacy*   Lab Work: Not needed    Testing/Procedures: Not needed   Follow-Up: At [facility], you and your health needs are our priority.  As part of our continuing mission to provide you with exceptional heart care, we have created designated Provider Care Teams.  These Care Teams include your primary Cardiologist (physician) and Advanced Practice Providers (APPs -  Physician Assistants and Nurse Practitioners) who all work together to provide you with the care you need, when you need it.  We recommend signing up for the patient portal called "MyChart".  Sign up information is provided on this After Visit Summary.  MyChart is used to connect with patients for Virtual Visits (Telemedicine).  Patients are able to view lab/test results, encounter notes, upcoming appointments, etc.  Non-urgent messages can be sent to your provider as well.   To learn more about what you can do with MyChart, go to [email or web address].    Your next appointment:   1 month(s)  The format for your next appointment:   Virtual Visit   Provider:   Bryan Lemma, MD   Other Instructions Will obtain your cath and echo film from [facility]    Studies Ordered:   No orders of the defined types were placed in this encounter.    Bryan Lemma, M.D., M.S. Interventional Cardiologist   Pager # [phone] Phone # [phone] [address] [place], [place] [address]   Thank you for choosing Heartcare at [facility]!!

## 2020-05-08 ENCOUNTER — Encounter: Payer: Self-pay | Admitting: Cardiology

## 2020-05-08 MED ORDER — TICAGRELOR 90 MG PO TABS: 90.0000 mg | ORAL_TABLET | Freq: Two times a day (BID) | ORAL | 0 refills | Status: DC

## 2020-05-08 NOTE — Assessment & Plan Note (Signed)
As mentioned, three-vessel disease noted.  They were not able to cross the LCx-OM lesion and therefore consulted CVTS.  Overwhelming diagnosis caused him to choose medical management at that point and seeking a second opinion.  I will need to review his cath films to know what the best option would be.  It sounds as though it would not be unreasonable to consider PCI of the LAD and possibly diagonal.  We could potentially consider CTO PCI of the LCx as well.  However need to see how diseased the LAD is before we can give this is an option.  We will send a request for CD of his cath and echo films.  (Able to review imaging is much preferred over reports)  Plan: Optimize medical management for now and await outside records to review.  We will see him back to discuss options.  Initiate Brilinta post non-STEMI: 180 mg x 1 followed by 90 mg twice daily along with aspirin  He is not taking metoprolol correctly, with some fatigue and baseline bradycardia, we will simply convert to 12 and half milligrams twice daily since he is taking 25 mg once a day now.  Continue ACE inhibitor but reduced dose to 2.5 mg and take at bedtime.  Continue statin  Once we know how we will revascularize, we can consider cardiac rehab consult.  Needs aggressive diabetes management.  For now we will simply continue with glipizide however needs to be started on adamant on Metformin and consider SGLT2 inhibitor.

## 2020-05-08 NOTE — Assessment & Plan Note (Signed)
Pretty poorly controlled diabetes.  Likely related to some dietary indiscretion leading to obesity.  Also significant deconditioning.  Was started on glipizide.  He needs to establish PCP to treat correctly.  He will try to set up PCP in the [facility].  For now continue glipizide, but will likely start Metformin plus or minus SGLT2 inhibitor on follow-up

## 2020-05-08 NOTE — Assessment & Plan Note (Signed)
He presented with chest pain and has been past pain-free since his admission.  He does have moderate disease in LAD and diagonal branch and occluded LCx.  Unfortunately do not have cath films.  He is doing well on medical management however not necessarily take medicines correctly.  He does not seem interested in the concept of CABG--however we need to review the films.  For now I would like to protect him post MI.  Plan: We will start Brilinta 180 mg x 1, followed by 90 mg twice daily going forward.

## 2020-05-08 NOTE — Assessment & Plan Note (Signed)
He is feeling fatigued after taking his beta-blocker.  He was not taking it twice daily.  My plan for now will be to reduce to 1/2 tablet twice daily.  May titrate further. I also asked him to take his ACE inhibitor at nighttime.

## 2020-05-08 NOTE — Assessment & Plan Note (Signed)
LDL in the setting of MI was 85 -> will need to reassess when he is convalescing.  For now he is on high-dose high intensity statin in the form of atorvastatin 80 mg.  Will be due for follow-up labs in roughly October.  This can be ordered after his next follow-up.

## 2020-05-09 ENCOUNTER — Telehealth: Payer: Self-pay | Admitting: Cardiology

## 2020-05-09 DIAGNOSIS — E118 Type 2 diabetes mellitus with unspecified complications: Secondary | ICD-10-CM

## 2020-05-09 NOTE — Telephone Encounter (Signed)
Patient is requesting to have a referral sent to [facility] at [place]. Please assist.

## 2020-05-10 NOTE — Addendum Note (Signed)
Addended by: Orlene Och on: 05/10/2020 10:38 AM   Modules accepted: Orders

## 2020-05-10 NOTE — Telephone Encounter (Signed)
Sounds a great idea.  I think Jasmine December is out this week.  We can review how to do that next week.  Bryan Lemma, MD

## 2020-05-11 NOTE — Telephone Encounter (Signed)
Spoke with Joann and placed order in [employer]

## 2020-05-11 NOTE — Telephone Encounter (Signed)
Follow Up:     Ryan [name] called and said pt is scheduled to start class tomorrow. All he needs is a referral, labs and any notes faxed to [phone] please or put it in Epic. Please let Ryan [name] know if you can have this today, pt needs this class.

## 2020-05-12 ENCOUNTER — Encounter: Payer: Self-pay | Admitting: Dietician

## 2020-05-12 ENCOUNTER — Other Ambulatory Visit: Payer: Self-pay

## 2020-05-12 ENCOUNTER — Encounter: Payer: PPO | Attending: Cardiology | Admitting: Dietician

## 2020-05-12 DIAGNOSIS — E118 Type 2 diabetes mellitus with unspecified complications: Secondary | ICD-10-CM | POA: Diagnosis not present

## 2020-05-12 NOTE — Progress Notes (Signed)
Patient was seen on 05/12/2020 for the first of a series of three diabetes self-management courses at the [facility].  Patient Education Plan per assessed needs and concerns is to attend three course education program for Diabetes Self Management Education.  The following learning objectives were met by the patient during this class:  Describe diabetes, types of diabetes and pathophysiology  State some common risk factors for diabetes  Defines the role of glucose and insulin  Describe the relationship between diabetes and cardiovascular and other risks  State the members of the Healthcare Team  States the rationale for glucose monitoring and when to test  State their individual Target Range  State the importance of logging glucose readings and how to interpret the readings  Identifies A1C target  Explain the correlation between A1c and eAG values  State symptoms and treatment of high blood glucose and low blood glucose  Explain proper technique for glucose testing and identify proper sharps disposal  Handouts given during class include:  How to Thrive:  A Guide for Your Journey with Diabetes by the ADA  Meal Plan Card and carbohydrate content list  Dietary intake form  Low Sodium Flavoring Tips  Types of Fats  Dining Out  Label reading  Snack list  Planning a balanced meal  The diabetes portion plate  Diabetes Resources  A1c to eAG Conversion Chart  Blood Glucose Log  Diabetes Recommended Care Schedule  Support Group  Diabetes Success Plan  Core Class Satisfaction Survey   Follow-Up Plan:  Attend core 2   

## 2020-05-19 ENCOUNTER — Encounter: Payer: Self-pay | Admitting: Dietician

## 2020-05-19 ENCOUNTER — Other Ambulatory Visit: Payer: Self-pay

## 2020-05-19 ENCOUNTER — Encounter: Payer: PPO | Admitting: Dietician

## 2020-05-19 DIAGNOSIS — E118 Type 2 diabetes mellitus with unspecified complications: Secondary | ICD-10-CM | POA: Diagnosis not present

## 2020-05-19 NOTE — Progress Notes (Signed)
Patient was seen on 05/19/2020 for the first of a series of three diabetes self-management courses at the [facility].  Patient Education Plan per assessed needs and concerns is to attend three course education program for Diabetes Self Management Education.  The following learning objectives were met by the patient during this class:  Describe diabetes, types of diabetes and pathophysiology  State some common risk factors for diabetes  Defines the role of glucose and insulin  Describe the relationship between diabetes and cardiovascular and other risks  State the members of the Healthcare Team  States the rationale for glucose monitoring and when to test  State their individual Sullivan the importance of logging glucose readings and how to interpret the readings  Identifies A1C target  Explain the correlation between A1c and eAG values  State symptoms and treatment of high blood glucose and low blood glucose  Explain proper technique for glucose testing and identify proper sharps disposal  Handouts given during class include:  How to Thrive:  A Guide for Your Journey with Diabetes by the ADA  Meal Plan Card and carbohydrate content list  Dietary intake form  Low Sodium Flavoring Tips  Types of Fats  Dining Out  Label reading  Snack list  Planning a balanced meal  The diabetes portion plate  Diabetes Resources  A1c to eAG Conversion Chart  Blood Glucose Log  Diabetes Recommended Care Schedule  Support Group  Diabetes Success Plan  Core Class Satisfaction Survey   Follow-Up Plan:  Attend core 2

## 2020-05-20 ENCOUNTER — Telehealth: Payer: Self-pay | Admitting: Cardiology

## 2020-05-20 NOTE — Telephone Encounter (Signed)
LVM2CB 9/10 

## 2020-05-20 NOTE — Telephone Encounter (Signed)
Have him stop the lisinopril and start valsartan 40 mg qd.  It may take up to 2-3 weeks for the cough to go away after stopping lisinopril

## 2020-05-20 NOTE — Telephone Encounter (Signed)
Pt called and said that he takes lisinopril (ZESTRIL) 5 MG tablet, and that he is experiencing a dry cough. Said he and Dr. Herbie Baltimore had a meeting stating that if things didn't change, he would change medications. Please call to verify

## 2020-05-23 MED ORDER — VALSARTAN 40 MG PO TABS: 40.0000 mg | ORAL_TABLET | Freq: Every day | ORAL | 3 refills | Status: DC

## 2020-05-23 NOTE — Telephone Encounter (Signed)
Follow up  ° ° °Patient is returning call.  °

## 2020-05-23 NOTE — Telephone Encounter (Signed)
Pt updated and verbalized understanding. New orders placed.  

## 2020-05-26 ENCOUNTER — Other Ambulatory Visit: Payer: Self-pay

## 2020-05-26 ENCOUNTER — Encounter: Payer: PPO | Admitting: Dietician

## 2020-05-26 ENCOUNTER — Encounter: Payer: Self-pay | Admitting: Dietician

## 2020-05-26 DIAGNOSIS — E118 Type 2 diabetes mellitus with unspecified complications: Secondary | ICD-10-CM

## 2020-05-26 NOTE — Progress Notes (Signed)
Patient was seen on 05/26/2020 for the third of a series of three diabetes self-management courses at the [facility].   Ryan [name] the amount of activity recommended for healthy living . Describe activities suitable for individual needs . Identify ways to regularly incorporate activity into daily life . Identify barriers to activity and ways to over come these barriers  Identify diabetes medications being personally used and their primary action for lowering glucose and possible side effects . Describe role of stress on blood glucose and develop strategies to address psychosocial issues . Identify diabetes complications and ways to prevent them  Explain how to manage diabetes during illness . Evaluate success in meeting personal goal . Establish 2-3 goals that they will plan to diligently work on  Goals:   I will count my carb choices at most meals and snacks  I will be active 30 minutes or more 2 times a week  I will take my diabetes medications as scheduled  I will eat less unhealthy fats by eating less red meat  I will test my glucose at least 2 times a day, 7 days a week  To help manage stress I will  pray at least 14 times a week  Your patient has identified these potential barriers to change:  Stress Lack of Family Support  Your patient has identified their diabetes self-care support plan as   None Indicated    Plan:  Attend Support Group as desired

## 2020-05-30 ENCOUNTER — Telehealth: Payer: Self-pay | Admitting: *Deleted

## 2020-05-30 NOTE — Telephone Encounter (Signed)
  Patient Consent for Virtual Visit         Ryan [name] has provided verbal consent on 05/30/2020 for a virtual visit (video or telephone).   CONSENT FOR VIRTUAL VISIT FOR:  Ryan [name]  By participating in this virtual visit I agree to the following:  I hereby voluntarily request, consent and authorize [facility] and its employed or contracted physicians, Producer, television/film/video, nurse practitioners or other licensed health care professionals (the Practitioner), to provide me with telemedicine health care services (the "Services") as deemed necessary by the treating Practitioner. I acknowledge and consent to receive the Services by the Practitioner via telemedicine. I understand that the telemedicine visit will involve communicating with the Practitioner through live audiovisual communication technology and the disclosure of certain medical information by electronic transmission. I acknowledge that I have been given the opportunity to request an in-person assessment or other available alternative prior to the telemedicine visit and am voluntarily participating in the telemedicine visit.  I understand that I have the right to withhold or withdraw my consent to the use of telemedicine in the course of my care at any time, without affecting my right to future care or treatment, and that the Practitioner or I may terminate the telemedicine visit at any time. I understand that I have the right to inspect all information obtained and/or recorded in the course of the telemedicine visit and may receive copies of available information for a reasonable fee.  I understand that some of the potential risks of receiving the Services via telemedicine include:  Marland Kitchen Delay or interruption in medical evaluation due to technological equipment failure or disruption; . Information transmitted may not be sufficient (e.g. poor resolution of images) to allow for appropriate medical decision making by the Practitioner;  and/or  . In rare instances, security protocols could fail, causing a breach of personal health information.  Furthermore, I acknowledge that it is my responsibility to provide information about my medical history, conditions and care that is complete and accurate to the best of my ability. I acknowledge that Practitioner's advice, recommendations, and/or decision may be based on factors not within their control, such as incomplete or inaccurate data provided by me or distortions of diagnostic images or specimens that may result from electronic transmissions. I understand that the practice of medicine is not an exact science and that Practitioner makes no warranties or guarantees regarding treatment outcomes. I acknowledge that a copy of this consent can be made available to me via my patient portal [facility] MyChart), or I can request a printed copy by calling the office of [facility].    I understand that my insurance will be billed for this visit.   I have read or had this consent read to me. . I understand the contents of this consent, which adequately explains the benefits and risks of the Services being provided via telemedicine.  . I have been provided ample opportunity to ask questions regarding this consent and the Services and have had my questions answered to my satisfaction. . I give my informed consent for the services to be provided through the use of telemedicine in my medical care

## 2020-06-03 ENCOUNTER — Telehealth (INDEPENDENT_AMBULATORY_CARE_PROVIDER_SITE_OTHER): Payer: PPO | Admitting: Cardiology

## 2020-06-03 ENCOUNTER — Encounter: Payer: Self-pay | Admitting: Cardiology

## 2020-06-03 ENCOUNTER — Telehealth: Payer: PPO | Admitting: Cardiology

## 2020-06-03 VITALS — BP 110/63 | HR 53 | Ht 73.0 in | Wt 235.0 lb

## 2020-06-03 DIAGNOSIS — E785 Hyperlipidemia, unspecified: Secondary | ICD-10-CM

## 2020-06-03 DIAGNOSIS — I25110 Atherosclerotic heart disease of native coronary artery with unstable angina pectoris: Secondary | ICD-10-CM

## 2020-06-03 DIAGNOSIS — R5383 Other fatigue: Secondary | ICD-10-CM | POA: Diagnosis not present

## 2020-06-03 DIAGNOSIS — E118 Type 2 diabetes mellitus with unspecified complications: Secondary | ICD-10-CM | POA: Diagnosis not present

## 2020-06-03 NOTE — Assessment & Plan Note (Signed)
Due for labs to be checked.  Will recheck in mid October.  Probably will do fine with reduced dose of atorvastatin to 40 mg.

## 2020-06-03 NOTE — Assessment & Plan Note (Addendum)
Beta-blocker seems to be causing an issue.    Plan  Reduce dose to 1/2 tablet nightly, if tolerated then switch to Toprol at night when symptoms discontinue beta-blocker since he has low pressures anyway.  Doing better with switching from ACE inhibitor to ARB.  Continue ARB.  Cut atorvastatin dose in half.  Check TSH, CBC and testosterone levels just to ensure there is no other reason for fatigue.   I have some concern that fatigue may be partly his anginal equivalent.  He does not have any improvement with this change medications, probably need to consider PCI.

## 2020-06-03 NOTE — Assessment & Plan Note (Signed)
A1c was over 10.  He seems to think all his sugars are doing well.  I do not think he gets a full senses through the level of illness he has prediabetes, blood pressure and CAD.  He does need a PCP.  He is currently on glipizide will probably need for further therapy.  All I suspect Metformin plus or minus SGLT2 inhibitor would be a good option.

## 2020-06-03 NOTE — Progress Notes (Deleted)
Primary Care Provider: Patient, No Pcp Per Cardiologist: Bryan Lemma, MD Electrophysiologist: None  Clinic Note: No chief complaint on file.    HPI:    Ryan [name] is a 69 y.o. male with a PMH below who presents today for ***. Ryan [name] is a 69 y.o. male who is being seen today for the evaluation of *** at the request of No ref. provider found.  Ryan [name] is a 69 y.o. male with a PMH notable for recent Non-STEMI April 04, 2020 (Cardiac cath with three-vessel disease currently on medical therapy) and new diagnosis of DM-2 who presents today for establishing cardiology care, transferring from [facility].  Ryan [name] was discharged from [facility] on medical management per his request.  He is now transferring care to [facility] partly due to location, insurance issues and feeling as though he did not understand what was happening when he went for his non-STEMI.  Recent Hospitalizations:   July 24-27, 2021: Initially presented to [facility] [place] with left-sided chest pain/pressure occurring at rest.  Had previously had an episode on 03/29/2020 associated some nausea and mild biliary emesis.  That resolved after , but when the symptoms recurred and woke him up from sleep on 24th he had similar chest discomfort 5/10 in severity without radiation but associated nausea and static biliary emesis.  He first went back to bed but symptoms continued so he drove to the [facility] ER ? He was evaluated with a coronary CTA and actually ruled in for non-STEMI.  Was transferred to [facility]. ? Also diagnosed with DM-2 (A1c 10.3.  Started on glipizide. ? Cardiac cath on Monday, 04/05/2020 revealing occluded LCx, unable to cross with wire also with LAD and diagonal disease.  Was given the option of CABG versus PCI.  Became overwhelmed with these diagnosis, and indicated that he needed to leave because he had business  to take care of. ->  He opted medical management for now.  Was discharged on aspirin, statin, lisinopril at low-dose along with Imdur 30 mg and 25 mg twice daily metoprolol which he is only taking once daily as opposed to twice daily.   Ryan [name] was last seen on ***  Recent Hospitalizations: ***  Reviewed  CV studies:    The following studies were reviewed today: (if available, images/films reviewed: From Epic Chart or Care Everywhere) . ***:   Interval History:   Ryan [name]    second opinion and partially because of insurance issues.  He has not had any further chest pain episodes since his discharge.  He did go on a trip up to [place] with his friend for a car show.  He indicated that on the weeks leading up to his MI, he was under a lot of stress with the divorce.  He was moving a lot of heavy boxes.  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  He was moving all of his equipment to his new home.  He did indicate that several days prior to going to the ER he had had that episode of chest pain that he sort of "blew off ".  Since his discharge, he really has not been doing very much as far as activity goes.  He says his blood pressures at home are usually if any little low with a low being 92/50 and average being about 120/60 mmHg.  He has had  some dizziness associated with his.  Is been very very anxious about what he was told.  He was not able to conceptualize at all what information he was given.  He indicates that he has been scared to do much activity but he is walking about 30 minutes at a time and has not had any further chest pain.  He is not walking hardly doing any physical exertion.  No heart failure symptoms of PND, orthopnea edema and no palpitations.  CV Review of Symptoms (Summary) Cardiovascular ROS: {roscv:310661}   Cardiovascular ROS: positive for - Recent non-STEMI but no further chest pain or  pressure. negative for - chest pain, dyspnea on exertion, edema, irregular heartbeat, orthopnea, palpitations, paroxysmal nocturnal dyspnea, rapid heart rate, shortness of breath or Syncope/near syncope and TIA/amaurosis fugax, claudication  The patient {does/does not:200015} have symptoms concerning for COVID-19 infection (fever, chills, cough, or new shortness of breath).   REVIEWED OF SYSTEMS   ROS  Constitutional: Negative for malaise/fatigue (As felt a little tired, but not sure how he supposed to feel..  Easily feels a little lethargy and fatigue in the morning, but usually better by the evening.) and weight loss.  HENT: Negative for congestion and nosebleeds.   Respiratory: Negative for cough and shortness of breath.   Gastrointestinal: Negative for abdominal pain, blood in stool and melena.       No GI issues since the nausea he he had with his MI.  Genitourinary: Negative for hematuria.  Musculoskeletal: Positive for joint pain (Both knees).  Neurological: Positive for tingling (He still has some intermittent left arm numbness from his C-spine surgery.). Negative for dizziness, focal weakness, weakness and headaches.  Psychiatric/Behavioral: Negative for depression. The patient is nervous/anxious.    I have reviewed and (if needed) personally updated the patient's problem list, medications, allergies, past medical and surgical history, social and family history.   PAST MEDICAL HISTORY   Past Medical History:  Diagnosis Date  . Cervical spine arthritis 2007   C6-7 hemidiscectomy  . Coronary artery disease 04/02/2020   Cardiac cath on 04/04/2020 for non-STEMI: mLAD ~70% & small-mod D1 60-65%, LCx CTO with L-L & R-L collateral filling OM (unable to cross); Large Dom RCA (ectatic with diffuse mild irregularties) --< RPDA & PRAV-PL mild-mod irregulatities. EF 50-55%, anterolateral-apical HK. EDP 10 mmHg   . COVID-19 virus infection 09/2019  . Diabetes mellitus type 2 with  complications, uncontrolled (HCC) 03/2020   Diagnosed in setting of non-STEMI; A1c 10.4.  Marland Kitchen DJD (degenerative joint disease) of knee    Bilateral knees.  . Genital herpes    Has standing dose of Valtrex  . Hyperlipidemia   . Hypertension   . OSA on CPAP 2000  . Pulmonary nodules 03/2020   CTA-PE protocol (04/02/2020): No PE.  Lung nodules measuring up to 3 mm noted.    PAST SURGICAL HISTORY   Past Surgical History:  Procedure Laterality Date  . CERVICAL SPINE SURGERY  2007   C6-7 hemidiscectomy  . LEFT HEART CATH AND CORONARY ANGIOGRAPHY  04/04/2020   Lucrezia Starch, MD; [facility]: mLAD ~70% & (small-moderate) D1 60 to 70%; nondominant LCx mid vessel CTO prior to OM (OM fills via L-L and R-L collaterals); large-dominant RCA with mild ectasia and diffuse irregularities.  Bifurcates into RPDA and RPL V-PL with mild to moderate irregularities.  EF 50 to 55%.  Anterolateral-apical HK.  LVEDP 10 mmHg.  Marland Kitchen TRANSTHORACIC ECHOCARDIOGRAM  04/03/2020    [facility]) normal LV size and thickness.  EF 55 to 60%.  Possible mid apical lateral HK.  Not well visualized RV.  Otherwise normal valves.     There is no immunization history on file for this patient.  MEDICATIONS/ALLERGIES   Current Meds  Medication Sig  . aspirin 81 MG EC tablet Take 81 mg by mouth.  Marland Kitchen atorvastatin (LIPITOR) 80 MG tablet Take 80 mg by mouth.  . cetirizine (ZYRTEC) 10 MG tablet Take by mouth.  Marland Kitchen glipiZIDE (GLUCOTROL XL) 10 MG 24 hr tablet Take 10 mg by mouth daily.  . isosorbide mononitrate (IMDUR) 30 MG 24 hr tablet Take 30 mg by mouth daily.  . metoprolol tartrate (LOPRESSOR) 25 MG tablet Take 0.5 tablets (12.5 mg total) by mouth 2 (two) times daily.  . ticagrelor (BRILINTA) 90 MG TABS tablet Take 1 tablet (90 mg total) by mouth 2 (two) times daily.  . valsartan (DIOVAN) 40 MG tablet Take 1 tablet (40 mg total) by mouth daily.    No Known  Allergies  SOCIAL HISTORY/FAMILY HISTORY   Reviewed in Epic:  Pertinent findings: ***  OBJCTIVE -PE, EKG, labs   Wt Readings from Last 3 Encounters:  06/03/20 235 lb (106.6 kg)  05/12/20 234 lb 3.2 oz (106.2 kg)  05/05/20 235 lb 12.8 oz (107 kg)    Physical Exam: BP 110/63   Pulse (!) 53   Ht 6\' 1"  (1.854 m)   Wt 235 lb (106.6 kg)   BMI 31.00 kg/m  Physical Exam   Adult ECG Report  Rate: *** ;  Rhythm: {rhythm:17366};   Narrative Interpretation: ***  Recent Labs:  ***  No results found for: CHOL, HDL, LDLCALC, LDLDIRECT, TRIG, CHOLHDL No results found for: CREATININE, BUN, NA, K, CL, CO2 No results found for: TSH  ASSESSMENT/PLAN    Problem List Items Addressed This Visit    None       COVID-19 Education: The signs and symptoms of COVID-19 were discussed with the patient and how to seek care for testing (follow up with PCP or arrange E-visit).   The importance of social distancing and COVID-19 vaccination was discussed today. *** min The patient {ACTION; IS/IS practicing social distancing & Masking.   I spent a total of ***minutes with the patient spent in direct patient consultation.  Additional time spent with chart review  / charting (studies, outside notes, etc): *** Total Time: *** min   Current medicines are reviewed at length with the patient today.  (+/- concerns) ***  Notice: This dictation was prepared with Dragon dictation along with smaller phrase technology. Any transcriptional errors that result from this process are unintentional and may not be corrected upon review.  Patient Instructions / Medication Changes & Studies & Tests Ordered   There are no Patient Instructions on file for this visit.   Studies Ordered:   No orders of the defined types were placed in this encounter.    XBM:[id number]}, M.D., M.S. Interventional Cardiologist   Pager # [phone] Phone # [phone] [address] [place],  [place]   Thank you for choosing [facility] at [facility]!!

## 2020-06-03 NOTE — Assessment & Plan Note (Signed)
Reported three-vessel disease with occluded circumflex OM and LAD/diagonal disease.  Unfortunately do not have the films.  We are still try to get the films from [facility].  Would be nice to review films and understanding the reasoning behind doing two-vessel PCI.  Currently is not actively having angina symptoms like to continue medical management first and reassess symptoms in shoulder.  Worried to have chest discomfort issues with issues concerning for his previous anginal episode threshold to consider simply recapping without previous films.  Plan: Continue to push to get outside films.  He is on Brilinta and is having some dyspnea, low threshold to consider switching from Brilinta to Plavix or Effient. Recommend he takes with a cup of coffee or tea/soda  Continue aspirin.  Not doing very well with beta-blocker we will have him back off on his morning dose so that he takes 1/2 tablet only in the evening.  If he tolerates this, would switch to Toprol, if not he can simply switch.  We will also reduce atorvastatin dose to 40 mg  Doing better when converted from a standard ARB.  Ultimately we reviewed films to determine if he needs to go for PCI.  We can still continue medical management for now.

## 2020-06-03 NOTE — Progress Notes (Signed)
Virtual Visit via Telephone Note   This visit type was conducted due to national recommendations for restrictions regarding the COVID-19 Pandemic (e.g. social distancing) in an effort to limit this patient's exposure and mitigate transmission in our community.  Due to his co-morbid illnesses, this patient is at least at moderate risk for complications without adequate follow up.  This format is felt to be most appropriate for this patient at this time.  The patient did not have access to video technology/had technical difficulties with video requiring transitioning to audio format only (telephone).  All issues noted in this document were discussed and addressed.  No physical exam could be performed with this format.  Please refer to the patient's chart for his  consent to telehealth for [facility].   Patient has given verbal permission to conduct this visit via virtual appointment and to bill insurance 06/03/2020 12:11 PM     Evaluation Performed:  Follow-up visit  Date:  06/03/2020   ID:  Ryan [name], DOB 12/18/1950, MRN [id number]  Patient Location: Home Provider Location: Office/Clinic  PCP:  Patient, No Pcp Per  Cardiologist:  Bryan Lemma, MD -transfer care from [facility]. Electrophysiologist:  None   Chief Complaint:  No chief complaint on file.    History of Present Illness:    Ryan [name] is a 69 y.o. male with PMH notable for recent non-STEMI (April 04, 2020-transferred from [place] to [facility] cardiac cath showed three-vessel disease, no PCI as patient was unsure of PCI versus CABG), new diagnosis of DM-2 who presents via Web designer for a telehealth visit today.  As close follow-up.   Non-STEMI April 02, 2020(Cardiac cathwith three-vessel disease currently on medical therapy)  -> initially presented to [facility] with what is reported left-sided chest pain and pressure cardiothoracic (which he  mostly notices nausea and biliary emesis).  Symptoms did wake him up from sleep but he was able to go back to sleep after 15 to .  He woke up again with worsening symptoms again described as 5/10 chest discomfort with nausea (he again denies this.  Transferred from [facility] to [facility], [place].  Cardiac cath on Monday, 04/05/2020 revealing occluded LCx, unable to cross with wire; also with LAD 70% and diagonal disease (D1 60 to 70%). Was given the option of CABG versus PCI. Became overwhelmed with these diagnosis, and indicated that he needed to leave because he had business to take care of. ->He opted medical management for now.  Also diagnosed with DM-2, A1c 10.3-started on glipizide  He was discharged from [facility] on medical management per his request. He is now transferring care to [facility] partly due to location, insurance issues and feeling as though he did not understand what was happening when he went for his non-STEMI.  Ryan [name] was initially seen on August 26 2 establish care with [facility], indication was that he was wanting transfer from [facility] health Cardiology (probably related to location probably related to insurance issues).  [place] indicated that he did not necessarily understand all of what happened when he was in the hospital.  He was somewhat overwhelmed by the diagnoses that were given to him and did not want to consider CABG at that time.  He therefore left on medical management only.  Since his discharge she went on a trip to [place] with his friend for a car show.  Did not do much during that trip, but was  asymptomatic.  He did indicate that this episode was preceded by an going to a quite stressful and emotional divorce.  He was doing a lot more physical exertion than he usually would. -> During his visit he did indicate having chest discomfort a few days before going to the emergency room there is associated some  nausea but no vomiting.  He told me that when he went emergency room he had recurrence of symptoms and it was associated with significant nausea and dry heaves.  He went back to bed, but symptoms recurred.  Since discharge he said he was doing relatively well but not being very active.  Blood pressure 7 if any little low and he is feeling very fatigued.  He still is having a hard time coming to grips with his diagnosis.  Was scared to do any that level activities.  No palpitations or heart failure symptoms.  No further chest pain.  He did note fatigue.  He did note intermittent dyspnea.  Noted significant fatigue with beta-blocker  We reduced his dose of lisinopril because of concern for hypotension, then with concern for cough he was switched to losartan.  We will cut his beta-blocker dose in half  Plan was to try to obtain outside films (unfortunately not yet available) I was able to see reports.  Need to consider plus or minus PCI versus medical management versus CABG.  Therefore close follow-up scheduled.  Hospitalizations:  . No further hospitalizations   Recent - Interim CV studies:   The following studies were reviewed today: . No new studies:  Inerval History   Ryan [name] is following up today to discuss symptoms and future plans.  He tells me that he actually feels okay the biggest thing is he is definitely is noticing fatigue not being to go beyond maybe 3, in the afternoon without just feeling extremely tired.  He has not had any chest tightness pressure or dyspnea with rest or exertion, but has not been doing very much.  He is a little reluctant to get fully back and activity.  But he has not really noticed exertional dyspnea per se unless he is overly aggressive.  Has been able to go up and down stairs and up and down hills without getting dyspneic or chest comfort.  He has some dyspnea associated with congestion and allergy symptoms, but not related to simply exertion.  When he is  having his episodes of dyspnea that lasts a few minutes only occurring at rest maybe twice a day at the most but usually once or none.  These episodes usually relieved with taking couple deep breaths.  He generally notices having fatigue and being worn out.  He has no energy, lethargic.  He does say that changing to valsartan did make a difference.Marland Kitchen.  He makes it until about 2-3 in the afternoon and then just feels exhausted.  He has a nighttime dinner meeting does very poorly and feels extremely tired the next day. Otherwise during the day when he is active he is able to do lawnmowing chores.  With this he denies any exertional dyspnea or chest pain or notable anginal type symptoms.  Cardiovascular ROS: positive for - dyspnea on exertion and Fatigue, lethargy.  Dyspnea at rest but not with exertion (in fact may better with exertion) -symptoms described as short-lived gasping sensations improved with deep breath intermittent episodes of fast heart rates but not persistent negative for - chest pain, edema, palpitations, paroxysmal nocturnal dyspnea, shortness of breath  or Mostly profound fatigue  Cardiovascular NWG:[id number] for -Recent non-STEMI but no further chest pain or pressure. negative for -chest pain, dyspnea on exertion, edema, irregular heartbeat, orthopnea, palpitations, paroxysmal nocturnal dyspnea, rapid heart rate, shortness of breath orSyncope/near syncope and TIA/amaurosis fugax, claudication    ROS:  Please see the history of present illness.    The patient does not have symptoms concerning for COVID-19 infection (fever, chills, cough, or new shortness of breath).  Review of Systems  Constitutional: Positive for malaise/fatigue.  HENT: Negative for congestion.   Respiratory: Positive for shortness of breath. Negative for wheezing.   Cardiovascular: Negative for chest pain.  Gastrointestinal: Positive for blood in stool (Some mild blood with wiping). Negative for melena.        Overall GI symptoms seem to have improved since his event.  Still has some nausea but not that much.  Just mostly fatigue  Genitourinary: Negative for hematuria.  Musculoskeletal: Negative for falls and myalgias.  Neurological: Positive for dizziness (Mild intermittently). Negative for weakness and headaches.  Psychiatric/Behavioral: Negative for memory loss and suicidal ideas. The patient is nervous/anxious. The patient does not have insomnia.    \  GI issues better.  Still having regularity issues. No more nausea.  Stable joint pain.  Mostly feels fatigue.  The patient is practicing social distancing.  Past Medical History:  Diagnosis Date  . Cervical spine arthritis 2007   C6-7 hemidiscectomy  . Coronary artery disease 04/02/2020   Cardiac cath on 04/04/2020 for non-STEMI: mLAD ~70% & small-mod D1 60-65%, LCx CTO with L-L & R-L collateral filling OM (unable to cross); Large Dom RCA (ectatic with diffuse mild irregularties) --< RPDA & PRAV-PL mild-mod irregulatities. EF 50-55%, anterolateral-apical HK. EDP 10 mmHg   . COVID-19 virus infection 09/2019  . 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  Marland Kitchen DJD (degenerative joint disease) of knee    Bilateral knees.  . Genital herpes    Has standing dose of Valtrex  . Hyperlipidemia   . Hypertension   . OSA on CPAP 2000  . Pulmonary nodules 03/2020   CTA-PE protocol (04/02/2020): No PE.  Lung nodules measuring up to 3 mm noted.   Past Surgical History:  Procedure Laterality Date  . CERVICAL SPINE SURGERY  2007   C6-7 hemidiscectomy  . LEFT HEART CATH AND CORONARY ANGIOGRAPHY  04/04/2020   Lucrezia Starch, MD; [facility]: mLAD ~70% & (small-moderate) D1 60 to 70%; nondominant LCx mid vessel CTO prior to OM (OM fills via L-L and R-L collaterals); large-dominant RCA with mild ectasia and diffuse irregularities.  Bifurcates into RPDA  and RPL V-PL with mild to moderate irregularities.  EF 50 to 55%.  Anterolateral-apical HK.  LVEDP 10 mmHg.  Marland Kitchen TRANSTHORACIC ECHOCARDIOGRAM  04/03/2020    [facility]) normal LV size and thickness.  EF 55 to 60%.  Possible mid apical lateral HK.  Not well visualized RV.  Otherwise normal valves.     Current Meds  Medication Sig  . aspirin 81 MG EC tablet Take 81 mg by mouth.  Marland Kitchen atorvastatin (LIPITOR) 80 MG tablet Take 80 mg by mouth.  . cetirizine (ZYRTEC) 10 MG tablet Take by mouth.  Marland Kitchen glipiZIDE (GLUCOTROL XL) 10 MG 24 hr tablet Take 10 mg by mouth daily.  . isosorbide mononitrate (IMDUR) 30 MG 24 hr tablet Take 30 mg by mouth daily.  . metoprolol tartrate (  LOPRESSOR) 25 MG tablet Take 0.5 tablets (12.5 mg total) by mouth 2 (two) times daily.  . ticagrelor (BRILINTA) 90 MG TABS tablet Take 1 tablet (90 mg total) by mouth 2 (two) times daily.  . valsartan (DIOVAN) 40 MG tablet Take 1 tablet (40 mg total) by mouth daily.     Allergies:   Patient has no known allergies.   Social History   Tobacco Use  . Smoking status: Never Smoker  . Smokeless tobacco: Never Used  Substance Use Topics  . Alcohol use: Yes    Alcohol/week: 3.0 standard drinks    Types: 3 Standard drinks or equivalent per week    Comment: Occasional social  . Drug use: Never     Family Hx: The patient's family history includes Diabetes in his father and another family member.   Labs/Other Tests and Data Reviewed:    EKG:  No ECG reviewed.  Recent Labs: No results found for requested labs within last 8760 hours.   Recent Lipid Panel No results found for: CHOL, TRIG, HDL, CHOLHDL, LDLCALC, LDLDIRECT  Wt Readings from Last 3 Encounters:  06/03/20 235 lb (106.6 kg)  05/12/20 234 lb 3.2 oz (106.2 kg)  05/05/20 235 lb 12.8 oz (107 kg)     Objective:    Vital Signs:  BP 110/63   Pulse (!) 53   Ht 6\' 1"  (1.854 m)   Wt 235 lb (106.6 kg)   BMI 31.00 kg/m   VITAL  SIGNS:  reviewed Pleasant, No acute distress. A&O x 3.  Normal Mood & Affect Non-labored respirations   ASSESSMENT & PLAN:    Problem List Items Addressed This Visit    Coronary artery disease involving native coronary artery of native heart with unstable angina pectoris (HCC) (Chronic)    Reported three-vessel disease with occluded circumflex OM and LAD/diagonal disease.  Unfortunately do not have the films.  We are still try to get the films from [facility].  Would be nice to review films and understanding the reasoning behind doing two-vessel PCI.  Currently is not actively having angina symptoms like to continue medical management first and reassess symptoms in shoulder.  Worried to have chest discomfort issues with issues concerning for his previous anginal episode threshold to consider simply recapping without previous films.  Plan: Continue to push to get outside films.  He is on Brilinta and is having some dyspnea, low threshold to consider switching from Brilinta to Plavix or Effient. Recommend he takes with a cup of coffee or tea/soda  Continue aspirin.  Not doing very well with beta-blocker we will have him back off on his morning dose so that he takes 1/2 tablet only in the evening.  If he tolerates this, would switch to Toprol, if not he can simply switch.  We will also reduce atorvastatin dose to 40 mg  Doing better when converted from a standard ARB.  Ultimately we reviewed films to determine if he needs to go for PCI.  We can still continue medical management for now.       Hyperlipidemia with target LDL less than 70 (Chronic)    Due for labs to be checked.  Will recheck in mid October.  Probably will do fine with reduced dose of atorvastatin to 40 mg.      Type 2 diabetes mellitus with complication, without long-term current use of insulin (HCC) (Chronic)    A1c was over 10.  He seems to think all his sugars are doing well.  I  do not think he gets a full senses  through the level of illness he has prediabetes, blood pressure and CAD.  He does need a PCP.  He is currently on glipizide will probably need for further therapy.  All I suspect Metformin plus or minus SGLT2 inhibitor would be a good option.      Fatigue due to treatment    Beta-blocker seems to be causing an issue.    Plan  Reduce dose to 1/2 tablet nightly, if tolerated then switch to Toprol at night when symptoms discontinue beta-blocker since he has low pressures anyway.  Doing better with switching from ACE inhibitor to ARB.  Continue ARB.  Cut atorvastatin dose in half.  Check TSH, CBC and testosterone levels just to ensure there is no other reason for fatigue.   I have some concern that fatigue may be partly his anginal equivalent.  He does not have any improvement with this change medications, probably need to consider PCI.         COVID-19 Education: The signs and symptoms of COVID-19 were discussed with the patient and how to seek care for testing (follow up with PCP or arrange E-visit).   The importance of social distancing was discussed today.  Time:   Today, I have spent 33 minutes with the patient with telehealth technology discussing the above problems.  10 minutes charting.  We also spent close to half an hour trying to get obtain outside records.  Total 43 minutes plus additional time trying to obtain outside records   Medication Adjustments/Labs and Tests Ordered: Current medicines are reviewed at length with the patient today.  Concerns regarding medicines are outlined above.   Patient Instructions  Medication Instructions:    Reduce atorvastatin dose to 1/2 tablet daily (40 mg) -> lets hold off on changing the prescription until we see lab results  Stop taking the morning dose of metoprolol tartrate (which was 1/2 tablet) -> continue taking the nighttime dose of 1/2 tablet for 2 weeks.  If tolerating this and your energy level is better, my plan will be  to convert from the short acting to long-acting metoprolol.  If still having fatigue, stop the nighttime dose  *If you need a refill on your cardiac medications before your next appointment, please call your pharmacy*   Lab Work:  In mid October (15-30th): CMP, fasting lipid panel, A1c, TSH, CBC, testosterone   If you have labs (blood work) drawn today and your tests are completely normal, you will receive your results only by: Marland Kitchen MyChart Message (if you have MyChart) OR . A paper copy in the mail If you have any lab test that is abnormal or we need to change your treatment, we will call you to review the results.   Testing/Procedures: None for now, however if you start having exertional shortness of breath or tightness in your chest, would consider relook heart catheterization and possible stents   Follow-Up: At [facility], you and your health needs are our priority.  As part of our continuing mission to provide you with exceptional heart care, we have created designated Provider Care Teams.  These Care Teams include your primary Cardiologist (physician) and Advanced Practice Providers (APPs -  Physician Assistants and Nurse Practitioners) who all work together to provide you with the care you need, when you need it.  We recommend signing up for the patient portal called "MyChart".  Sign up information is provided on this After Visit Summary.  MyChart is  used to connect with patients for Virtual Visits (Telemedicine).  Patients are able to view lab/test results, encounter notes, upcoming appointments, etc.  Non-urgent messages can be sent to your provider as well.   To learn more about what you can do with MyChart, go to [email or web address].    Your next appointment:   2 month(s)  The format for your next appointment:   Can be virtual or in person  Provider:   Bryan Lemma, MD   Other Instructions Keep staying active Try taking Brilinta with a little bit of caffeine to  see if this helps the shortness of breath issues     Signed, Bryan Lemma, MD  06/03/2020 12:11 PM    [facility]

## 2020-06-03 NOTE — Patient Instructions (Signed)
Medication Instructions:    Reduce atorvastatin dose to 1/2 tablet daily (40 mg) -> lets hold off on changing the prescription until we see lab results  Stop taking the morning dose of metoprolol tartrate (which was 1/2 tablet) -> continue taking the nighttime dose of 1/2 tablet for 2 weeks.  If tolerating this and your energy level is better, my plan will be to convert from the short acting to long-acting metoprolol.  If still having fatigue, stop the nighttime dose  *If you need a refill on your cardiac medications before your next appointment, please call your pharmacy*   Lab Work:  In mid October (15-30th): CMP, fasting lipid panel, A1c, TSH, CBC, testosterone   If you have labs (blood work) drawn today and your tests are completely normal, you will receive your results only by: Marland Kitchen MyChart Message (if you have MyChart) OR . A paper copy in the mail If you have any lab test that is abnormal or we need to change your treatment, we will call you to review the results.   Testing/Procedures: None for now, however if you start having exertional shortness of breath or tightness in your chest, would consider relook heart catheterization and possible stents   Follow-Up: At [facility], you and your health needs are our priority.  As part of our continuing mission to provide you with exceptional heart care, we have created designated Provider Care Teams.  These Care Teams include your primary Cardiologist (physician) and Advanced Practice Providers (APPs -  Physician Assistants and Nurse Practitioners) who all work together to provide you with the care you need, when you need it.  We recommend signing up for the patient portal called "MyChart".  Sign up information is provided on this After Visit Summary.  MyChart is used to connect with patients for Virtual Visits (Telemedicine).  Patients are able to view lab/test results, encounter notes, upcoming appointments, etc.  Non-urgent messages  can be sent to your provider as well.   To learn more about what you can do with MyChart, go to [email or web address].    Your next appointment:   2 month(s)  The format for your next appointment:   Can be virtual or in person  Provider:   Bryan Lemma, MD   Other Instructions Keep staying active Try taking Brilinta with a little bit of caffeine to see if this helps the shortness of breath issues

## 2020-06-30 ENCOUNTER — Encounter: Payer: Self-pay | Admitting: Family Medicine

## 2020-06-30 ENCOUNTER — Ambulatory Visit (INDEPENDENT_AMBULATORY_CARE_PROVIDER_SITE_OTHER): Payer: PPO | Admitting: Family Medicine

## 2020-06-30 VITALS — BP 161/71 | HR 78 | Temp 97.9°F | Ht 71.26 in | Wt 236.3 lb

## 2020-06-30 DIAGNOSIS — I10 Essential (primary) hypertension: Secondary | ICD-10-CM | POA: Diagnosis not present

## 2020-06-30 DIAGNOSIS — I214 Non-ST elevation (NSTEMI) myocardial infarction: Secondary | ICD-10-CM | POA: Diagnosis not present

## 2020-06-30 DIAGNOSIS — E785 Hyperlipidemia, unspecified: Secondary | ICD-10-CM

## 2020-06-30 DIAGNOSIS — E118 Type 2 diabetes mellitus with unspecified complications: Secondary | ICD-10-CM

## 2020-06-30 MED ORDER — DAPAGLIFLOZIN PROPANEDIOL 5 MG PO TABS: 5.0000 mg | ORAL_TABLET | Freq: Every day | ORAL | 3 refills | Status: DC

## 2020-06-30 MED ORDER — GLIPIZIDE ER 10 MG PO TB24: 10.0000 mg | ORAL_TABLET | Freq: Every day | ORAL | 1 refills | Status: DC

## 2020-06-30 NOTE — Patient Instructions (Signed)
Continue current medications.  Add farxiga to help with blood sugars See me again in 3 months.

## 2020-07-01 ENCOUNTER — Other Ambulatory Visit: Payer: Self-pay

## 2020-07-01 ENCOUNTER — Encounter: Payer: PPO | Attending: Cardiology | Admitting: Dietician

## 2020-07-01 DIAGNOSIS — E118 Type 2 diabetes mellitus with unspecified complications: Secondary | ICD-10-CM | POA: Diagnosis not present

## 2020-07-01 MED ORDER — DAPAGLIFLOZIN PROPANEDIOL 5 MG PO TABS: 5.0000 mg | ORAL_TABLET | Freq: Every day | ORAL | 3 refills | Status: DC

## 2020-07-01 NOTE — Progress Notes (Signed)
Patient attended Diabetes Core Classes 1-3 between 05/12/2020 and 05/26/2020 at [facility]. The purpose of the meeting today is to review information learned during those classes as well as review patient application and goals.   What are one or two positive things that you are doing right now to manage your diabetes?  Tracking food intake and corresponding blood sugar readings  What is the hardest part about your diabetes right now, causing you the most concern, or is the most worrisome to you about your diabetes?  Not knowing how certain foods affect blood sugar  What questions do you have today?  Meal ideas, medication side effects, and if fatigue is normal  Have you participated in any diabetes support group?  No  History:  Diagnosed with diabetes July 2021 A1C:  10.4% (not taken again since diagnosis)  Medications include: glipizide, dapagliflozin propanediol Sleep:  "sleeps well most of the time" Weight:  236 lbs (no change)  Blood Glucose:  Fasting:  ~100-110    2 hours after starting a meal:  <170  Have you had any low blood sugar readings in the past month?  no  Social History:  Patient states stress has been the major problem for him for the past few years. Hx of heart attack. States his blood glucose numbers have gone up over the past couple of weeks, and that he has felt lethargic and burned out. Follows his food intake very closely monitoring how many grams of carbohydrates eaten per meal.   24 hour diet recall: Breakfast: cereal + milk Snack: - Lunch: [place] burger + salad Snack: - Dinner: [place] sandwich Snack: grapes Beverages: water, sometimes flavored green tea w/ liquid Stevia  States he may have grilled chicken, tilapia, [place] burgers for protein at lunch or dinner, and will include vegetables and complex carbs. Rarely snacks, but may have something after dinner such as granola bars, grapes, carrots, etc.    Specific focus but not  limited to the following:  Review of blood glucose monitoring and interpretation including the recommended target ranges and Hgb A1c.   Review of carbohydrate counting, importance of regularly scheduled meals/snacks, and meal planning to improve quality of diet.  Review of the effects of physical activity on glucose levels and long-term glucose control.  Recommended goal of 150 minutes of physical activity/week.  Review of patient medications and discussed role of medication on blood glucose and possible side effects.  Discussion of strategies to manage stress, psychosocial issues, and other obstacles to diabetes management.  Review of Deanza Upperman-term complications: hyper- and hypo-glycemia (causes, symptoms, and treatment options)  Review of prevention, detection, and treatment of long-term complications.  Discussion of the role of prolonged elevated glucose levels on body systems.  Continuing Goals:  Continue to count carb choices at meals and snacks  Continue to be active for 30 minutes or more 2 times per week as able  Continue taking diabetes medications as directed   Continue eating less unhealthy fats   Continue checking blood sugar at least 2x/day 7 days/week   Continue to focus on managing your stress   Future Follow up:  PRN

## 2020-07-01 NOTE — Patient Instructions (Signed)
   Continue to count carb choices at meals and snacks  Continue to be active for 30 minutes or more 2 times per week as able  Continue taking diabetes medications as directed   Continue eating less unhealthy fats   Continue checking blood sugar at least 2x/day 7 days/week   Continue to focus on managing your stress

## 2020-07-03 DIAGNOSIS — I10 Essential (primary) hypertension: Secondary | ICD-10-CM | POA: Insufficient documentation

## 2020-07-03 NOTE — Assessment & Plan Note (Signed)
Readings elevated here but BP readings at home have been excellent.  He will continue current medications.

## 2020-07-03 NOTE — Progress Notes (Signed)
Ryan [name] - 69 y.o. male MRN [id number]  Date of birth: 01-28-1951  Subjective Chief Complaint  Patient presents with  . Establish Care    HPI Ryan [name] is a 69 y.o. male here today for initial visit. He has a history of MI, T2DM, HTN and HLD.    HTN/CAD:  He had MI in July 2021.  He had preserved EF of 55-60%.  He is seeing cardiology at this time as well.  Current medications include imdur and valsartan.  He is on Brilinta for antiplatelet therapy and atorvastatin for management of HLD.  He is tolerating medications pretty well.  He still has fatigue since MI but denies shortness of breath or anginal symptoms.  BP elevated today but has been checking at home with excellent control.    T2DM:  Current management with farxiga and glipizide.  He is tolerating this well.  He does not check blood sugars very often.  He denies symptoms related to diabetes including increased thirst or urination or neuropathy.    ROS:  A comprehensive ROS was completed and negative except as noted per HPI    No Known Allergies  Past Medical History:  Diagnosis Date  . Cervical spine arthritis 2007   C6-7 hemidiscectomy  . Coronary artery disease 04/02/2020   Cardiac cath on 04/04/2020 for non-STEMI: mLAD ~70% & small-mod D1 60-65%, LCx CTO with L-L & R-L collateral filling OM (unable to cross); Large Dom RCA (ectatic with diffuse mild irregularties) --< RPDA & PRAV-PL mild-mod irregulatities. EF 50-55%, anterolateral-apical HK. EDP 10 mmHg   . COVID-19 virus infection 09/2019  . 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  Marland Kitchen DJD (degenerative joint disease) of knee    Bilateral knees.  . Genital herpes    Has standing dose of Valtrex  . Hyperlipidemia   . Hypertension   . OSA on CPAP 2000  . Pulmonary nodules 03/2020   CTA-PE protocol (04/02/2020): No PE.  Lung nodules measuring up to 3 mm noted.  . Sleep apnea      Past Surgical History:  Procedure Laterality Date  . CERVICAL SPINE SURGERY  2007   C6-7 hemidiscectomy  . LEFT HEART CATH AND CORONARY ANGIOGRAPHY  04/04/2020   Lucrezia Starch, MD; [facility]: mLAD ~70% & (small-moderate) D1 60 to 70%; nondominant LCx mid vessel CTO prior to OM (OM fills via L-L and R-L collaterals); large-dominant RCA with mild ectasia and diffuse irregularities.  Bifurcates into RPDA and RPL V-PL with mild to moderate irregularities.  EF 50 to 55%.  Anterolateral-apical HK.  LVEDP 10 mmHg.  Marland Kitchen TRANSTHORACIC ECHOCARDIOGRAM  04/03/2020    [facility]) normal LV size and thickness.  EF 55 to 60%.  Possible mid apical lateral HK.  Not well visualized RV.  Otherwise normal valves.    Social History   Socioeconomic History  . Marital status: Divorced    Spouse name: Not on file  . Number of children: Not on file  . Years of education: Not on file  . Highest education level: Not on file  Occupational History  . Occupation: Retired  Tobacco Use  . Smoking status: Never Smoker  . Smokeless tobacco: Never Used  Vaping Use  . Vaping Use: Never used  Substance and Sexual Activity  . Alcohol use: Yes    Alcohol/week: 3.0 standard drinks    Types: 3 Standard drinks  or equivalent per week    Comment: Occasional social  . Drug use: Never  . Sexual activity: Yes    Partners: Female  Other Topics Concern  . Not on file  Social History Narrative   He is currently completing a long very stressful and somewhat mean-spirited divorce.  Has been under a lot of stress with transition of property etc.  He is just wanting to make a full clean break.      Prior to his MI, he was very active walking 30 minutes most days of the week.   Social Determinants of Health   Financial Resource Strain:   . Difficulty of Paying Living Expenses: Not on file  Food Insecurity:   . Worried About Programme researcher, broadcasting/film/video in  the Last Year: Not on file  . Ran Out of Food in the Last Year: Not on file  Transportation Needs:   . Lack of Transportation (Medical): Not on file  . Lack of Transportation (Non-Medical): Not on file  Physical Activity:   . Days of Exercise per Week: Not on file  . Minutes of Exercise per Session: Not on file  Stress:   . Feeling of Stress : Not on file  Social Connections:   . Frequency of Communication with Friends and Family: Not on file  . Frequency of Social Gatherings with Friends and Family: Not on file  . Attends Religious Services: Not on file  . Active Member of Clubs or Organizations: Not on file  . Attends Banker Meetings: Not on file  . Marital Status: Not on file    Family History  Problem Relation Age of Onset  . Diabetes Father   . Diabetes Other        Several people in the family have diabetes, but no noted CAD.    Health Maintenance  Topic Date Due  . HEMOGLOBIN A1C  Never done  . Hepatitis C Screening  Never done  . FOOT EXAM  Never done  . OPHTHALMOLOGY EXAM  Never done  . COVID-19 Vaccine (1) Never done  . TETANUS/TDAP  Never done  . COLONOSCOPY  Never done  . PNA vac Low Risk Adult (1 of 2 - PCV13) Never done  . INFLUENZA VACCINE  Never done     ----------------------------------------------------------------------------------------------------------------------------------------------------------------------------------------------------------------- Physical Exam BP (!) 161/71 (BP Location: Left Arm, Patient Position: Sitting, Cuff Size: Large)   Pulse 78   Temp 97.9 F (36.6 C)   Ht 5' 11.26" (1.81 m)   Wt 236 lb 4.8 oz (107.2 kg)   SpO2 98%   BMI 32.72 kg/m   Physical Exam Constitutional:      Appearance: Normal appearance.  HENT:     Head: Normocephalic and atraumatic.  Eyes:     General: No scleral icterus. Cardiovascular:     Rate and Rhythm: Normal rate and regular rhythm.  Pulmonary:     Effort: Pulmonary  effort is normal.     Breath sounds: Normal breath sounds.  Skin:    General: Skin is warm and dry.  Neurological:     General: No focal deficit present.     Mental Status: He is alert.  Psychiatric:        Mood and Affect: Mood normal.        Behavior: Behavior normal.    Diabetic Foot Exam - Simple   Simple Foot Form Diabetic Foot exam was performed with the following findings: Yes 07/03/2020  6:13 PM  Visual Inspection No  deformities, no ulcerations, no other skin breakdown bilaterally: Yes Sensation Testing Intact to touch and monofilament testing bilaterally: Yes Pulse Check Posterior Tibialis and Dorsalis pulse intact bilaterally: Yes Comments      ------------------------------------------------------------------------------------------------------------------------------------------------------------------------------------------------------------------- Assessment and Plan  Type 2 diabetes mellitus with complication, without long-term current use of insulin [facility]) He reports that cardiologist is planning on getting several labs at next upcoming appointment and prefers to hold off on additional labs until after he has this completed.  Readings at home in the 140's-150's.  Instructed to check glucose readings at home and follow a low carbohydrate diet.  Will add farxiga as well.   He will continue glipizide at current strength.    Hyperlipidemia with target LDL less than 70 Tolerating atorvastatin well, continue.   Non-ST elevation (NSTEMI) myocardial infarction (HCC) No anginal symptoms.  Preserved EF on last Echo.  Current medications for post MI treatment and HTN per cardiology.   Essential hypertension Readings elevated here but BP readings at home have been excellent.  He will continue current medications.    Meds ordered this encounter  Medications  . DISCONTD: dapagliflozin propanediol (FARXIGA) 5 MG TABS tablet    Sig: Take 1 tablet (5 mg total) by mouth  daily before breakfast.    Dispense:  30 tablet    Refill:  3  . glipiZIDE (GLUCOTROL XL) 10 MG 24 hr tablet    Sig: Take 1 tablet (10 mg total) by mouth daily.    Dispense:  90 tablet    Refill:  1    Return in about 3 months (around 09/30/2020) for T2DM.    This visit occurred during the SARS-CoV-2 public health emergency.  Safety protocols were in place, including screening questions prior to the visit, additional usage of staff PPE, and extensive cleaning of exam room while observing appropriate contact time as indicated for disinfecting solutions.

## 2020-07-03 NOTE — Assessment & Plan Note (Signed)
No anginal symptoms.  Preserved EF on last Echo.  Current medications for post MI treatment and HTN per cardiology.

## 2020-07-03 NOTE — Assessment & Plan Note (Signed)
Tolerating atorvastatin well, continue.  

## 2020-07-03 NOTE — Assessment & Plan Note (Addendum)
He reports that cardiologist is planning on getting several labs at next upcoming appointment and prefers to hold off on additional labs until after he has this completed.  Readings at home in the 140's-150's.  Instructed to check glucose readings at home and follow a low carbohydrate diet.  Will add farxiga as well.   He will continue glipizide at current strength.

## 2020-07-04 ENCOUNTER — Encounter: Payer: Self-pay | Admitting: Family Medicine

## 2020-07-05 ENCOUNTER — Other Ambulatory Visit: Payer: Self-pay | Admitting: Family Medicine

## 2020-07-05 MED ORDER — EMPAGLIFLOZIN 10 MG PO TABS: 10.0000 mg | ORAL_TABLET | Freq: Every day | ORAL | 1 refills | Status: DC

## 2020-07-05 NOTE — Telephone Encounter (Signed)
Rx updated to [facility]

## 2020-07-11 ENCOUNTER — Other Ambulatory Visit: Payer: Self-pay | Admitting: Cardiology

## 2020-07-11 ENCOUNTER — Other Ambulatory Visit: Payer: Self-pay | Admitting: *Deleted

## 2020-07-11 DIAGNOSIS — E785 Hyperlipidemia, unspecified: Secondary | ICD-10-CM

## 2020-07-11 DIAGNOSIS — R5383 Other fatigue: Secondary | ICD-10-CM

## 2020-07-11 DIAGNOSIS — I25110 Atherosclerotic heart disease of native coronary artery with unstable angina pectoris: Secondary | ICD-10-CM

## 2020-07-11 DIAGNOSIS — I214 Non-ST elevation (NSTEMI) myocardial infarction: Secondary | ICD-10-CM

## 2020-07-11 DIAGNOSIS — E118 Type 2 diabetes mellitus with unspecified complications: Secondary | ICD-10-CM

## 2020-07-14 DIAGNOSIS — E118 Type 2 diabetes mellitus with unspecified complications: Secondary | ICD-10-CM | POA: Diagnosis not present

## 2020-07-14 DIAGNOSIS — E785 Hyperlipidemia, unspecified: Secondary | ICD-10-CM | POA: Diagnosis not present

## 2020-07-14 DIAGNOSIS — I214 Non-ST elevation (NSTEMI) myocardial infarction: Secondary | ICD-10-CM | POA: Diagnosis not present

## 2020-07-14 DIAGNOSIS — R5383 Other fatigue: Secondary | ICD-10-CM | POA: Diagnosis not present

## 2020-07-14 DIAGNOSIS — I25110 Atherosclerotic heart disease of native coronary artery with unstable angina pectoris: Secondary | ICD-10-CM | POA: Diagnosis not present

## 2020-07-14 LAB — HEMOGLOBIN A1C

## 2020-07-14 LAB — CBC
Hematocrit: 46.8 % (ref 37.5–51.0)
RDW: 12.6 % (ref 11.6–15.4)

## 2020-07-15 LAB — TSH: TSH: 2.91 u[IU]/mL (ref 0.450–4.500)

## 2020-07-15 LAB — COMPREHENSIVE METABOLIC PANEL
ALT: 34 IU/L (ref 0–44)
AST: 33 IU/L (ref 0–40)
Albumin/Globulin Ratio: 1.6 (ref 1.2–2.2)
Albumin: 4.6 g/dL (ref 3.8–4.8)
Alkaline Phosphatase: 59 IU/L (ref 44–121)
BUN/Creatinine Ratio: 13 (ref 10–24)
BUN: 17 mg/dL (ref 8–27)
Bilirubin Total: 0.7 mg/dL (ref 0.0–1.2)
CO2: 25 mmol/L (ref 20–29)
Calcium: 9.6 mg/dL (ref 8.6–10.2)
Chloride: 98 mmol/L (ref 96–106)
Creatinine, Ser: 1.3 mg/dL — ABNORMAL HIGH (ref 0.76–1.27)
GFR calc Af Amer: 65 mL/min/{1.73_m2} (ref >59)
GFR calc non Af Amer: 56 mL/min/{1.73_m2} — ABNORMAL LOW (ref >59)
Globulin, Total: 2.8 g/dL (ref 1.5–4.5)
Glucose: 127 mg/dL — ABNORMAL HIGH (ref 65–99)
Potassium: 4.8 mmol/L (ref 3.5–5.2)
Sodium: 138 mmol/L (ref 134–144)
Total Protein: 7.4 g/dL (ref 6.0–8.5)

## 2020-07-15 LAB — LIPID PANEL
Chol/HDL Ratio: 3.9 ratio (ref 0.0–5.0)
Cholesterol, Total: 122 mg/dL (ref 100–199)
HDL: 31 mg/dL — ABNORMAL LOW (ref >39)
LDL Chol Calc (NIH): 56 mg/dL (ref 0–99)
Triglycerides: 214 mg/dL — ABNORMAL HIGH (ref 0–149)
VLDL Cholesterol Cal: 35 mg/dL (ref 5–40)

## 2020-07-15 LAB — CBC
Hemoglobin: 16 g/dL (ref 13.0–17.7)
MCH: 33.2 pg — ABNORMAL HIGH (ref 26.6–33.0)
MCHC: 34.2 g/dL (ref 31.5–35.7)
MCV: 97 fL (ref 79–97)
Platelets: 244 10*3/uL (ref 150–450)
RBC: 4.82 x10E6/uL (ref 4.14–5.80)
WBC: 9.9 10*3/uL (ref 3.4–10.8)

## 2020-07-15 LAB — HEMOGLOBIN A1C: Hgb A1c MFr Bld: 7.3 % — ABNORMAL HIGH (ref 4.8–5.6)

## 2020-07-25 ENCOUNTER — Ambulatory Visit: Payer: PPO | Admitting: Cardiology

## 2020-07-29 ENCOUNTER — Encounter: Payer: Self-pay | Admitting: Cardiology

## 2020-07-29 ENCOUNTER — Ambulatory Visit: Payer: PPO | Admitting: Cardiology

## 2020-07-29 VITALS — BP 125/93 | HR 89 | Temp 97.2°F | Ht 73.0 in | Wt 233.6 lb

## 2020-07-29 DIAGNOSIS — E785 Hyperlipidemia, unspecified: Secondary | ICD-10-CM | POA: Diagnosis not present

## 2020-07-29 DIAGNOSIS — I10 Essential (primary) hypertension: Secondary | ICD-10-CM

## 2020-07-29 DIAGNOSIS — I25110 Atherosclerotic heart disease of native coronary artery with unstable angina pectoris: Secondary | ICD-10-CM

## 2020-07-29 DIAGNOSIS — R5383 Other fatigue: Secondary | ICD-10-CM

## 2020-07-29 DIAGNOSIS — I25119 Atherosclerotic heart disease of native coronary artery with unspecified angina pectoris: Secondary | ICD-10-CM

## 2020-07-29 DIAGNOSIS — E118 Type 2 diabetes mellitus with unspecified complications: Secondary | ICD-10-CM | POA: Diagnosis not present

## 2020-07-29 NOTE — Patient Instructions (Addendum)
Medication Instructions:    please do statin holiday for 2 -3 weeks  - if symptoms improve in the 3 week  , contact office will switch your  medication  Possible Rosuvastatin 20 mg  Daily    *If you need a refill on your cardiac medications before your next appointment, please call your pharmacy*   Lab Work: Not needed   Testing/Procedures:  Will be schedule at [address] Your physician has requested that you have en exercise stress myoview. Please follow instruction sheet, as given. You may need to have covid test 3 days  prior to test ,  Scheduler will give you the details  You will need a COVID-19  test prior to your procedure. You are scheduled for ----- at AM/PM. This is a Drive Up Visit at [address] [place], [place] [address]. Someone will direct you to the appropriate testing line. Stay in your car and someone will be with you shortly.  Follow-Up: At [facility], you and your health needs are our priority.  As part of our continuing mission to provide you with exceptional heart care, we have created designated Provider Care Teams.  These Care Teams include your primary Cardiologist (physician) and Advanced Practice Providers (APPs -  Physician Assistants and Nurse Practitioners) who all work together to provide you with the care you need, when you need it.     Your next appointment:   4 month(s)  The format for your next appointment:   In Person  Provider:   Bryan Lemma, MD   Other Instructions

## 2020-07-29 NOTE — Progress Notes (Signed)
Primary Care Provider: Luetta Nutting, DO Cardiologist: Glenetta Hew, MD Electrophysiologist: None  Clinic Note: Chief Complaint  Patient presents with  . Follow-up    2 months.  . Coronary Artery Disease    No angina  . Fatigue    Notably improved   HPI:    Ryan [name] is a 69 y.o. male with a PMH of CAD-non-STEMI (occluded LCx with report of 60 to 70% disease in LAD and D1), along with Hypertension & Hyperlipidemia with recently diagnosed DM-2 (A1c 10.3) who presents today for 32-monthfollow-up.   [facility] Cardiac Cath 7/27 revealed "three-vessel disease": CTO of LCx (unable to cross),~70% LAD, 60% D1. ->  Discussed potential CABG versus PCI.    He opted for initial medical management indicating [place]  I think he was somewhat scared by consider possible CABG.  Problem List Items Addressed This Visit    Coronary artery disease involving native coronary artery of native heart with angina pectoris (HCC) (Chronic)   Relevant Orders   Lipid panel   Comprehensive metabolic panel   MYOCARDIAL PERFUSION IMAGING   Cardiac Stress Test: Informed Consent Details: Physician/Practitioner Attestation; Transcribe to consent form and obtain patient signature   Hyperlipidemia with target LDL less than 70 (Chronic)   Type 2 diabetes mellitus with complication, without long-term current use of insulin (HCC) (Chronic)   Essential hypertension (Chronic)   Fatigue due to treatment - Primary     Ryan [name] was last seen on September 24 via telemedicine.  Initial visit was August 26 -> [facility]  He denied having any chest pain since hospitalization.  Still having a hard time coming to grips with the diagnosis, and was not sure what he should or should not do.  Noted significant fatigue with beta-blocker. => This improved with stopping the  beta-blocker.  We also converted from ACE inhibitor to ARB.  On follow-up, he mostly noted fatigue in the morning.  Making it about 2:58 in the afternoon before getting tired.  No real exertional dyspnea unless he was overly aggressive.  No chest tightness or pressure.  Able to go up and down stairs no issues.  Was noting intermittent bursts of short-lived dyspnea, trying to catch his breath lasting for few seconds to minutes.  Not associated with exertion.  I reduced his ARB dose.  We talked about taking Brilinta with caffeine.  Reduce atorvastatin to 40.  Plan was looking outside films.  Recent Hospitalizations: None  Reviewed  CV studies:    The following studies were reviewed today: (if available, images/films reviewed: From Epic Chart or Care Everywhere) . I personally reviewed his cardiac cath films, unfortunately I am unable to pull it back up again to look at them unless I have assistance with the IT personnel at the hospital.  On my review of the images, I was not convinced that the LAD or diagonal or significant.   Interval History:   Ryan SCHILLERreturns today actually doing fairly well.  He really says that he has energy level got better since we stopped the beta-blocker and reducing the dose of his ARB.  He said he had a couple little episodes of flip-flopping in his chest but nothing prolonged.  He said no chest pain or dyspnea with rest or exertion.  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  He has been actually doing more physical labor than he had been up until I last saw him.  He is moving boxes of car parts in and out of the garage.  He has not had to use nitroglycerin at all.  He really is gradually picked up his level of exercise and walking.  He still has a little brief episodes of shortness of breath almost a gasping sensation.  He does not drink caffeine so he is been taking  caffeine tablets.  He said that since he started doing that the dyspnea has improved.  CV Review of Symptoms (Summary): no chest pain or dyspnea on exertion positive for - A couple skipped beats here and there, little gasping sensations as noted, noted blood when wiping his nose recently, but no true nosebleed. negative for - edema, orthopnea, paroxysmal nocturnal dyspnea, rapid heart rate or Syncope/near syncope or TIA/amaurosis fugax, claudication  The patient does not have symptoms concerning for COVID-19 infection (fever, chills, cough, or new shortness of breath).   REVIEWED OF SYSTEMS   Review of Systems  Constitutional: Negative for malaise/fatigue (Energy level has definitely improved with the changes.) and weight loss.  HENT: Negative for nosebleeds (Has noted blood when wiping his nose, but not nosebleeds.).   Respiratory: Negative for cough and shortness of breath.   Cardiovascular: Negative for leg swelling.  Gastrointestinal: Negative for abdominal pain, blood in stool (Only occasionally with wiping.), diarrhea and melena.  Genitourinary: Negative for hematuria.  Musculoskeletal: Positive for joint pain (His knees hurt and which is limiting his walking.). Negative for falls.  Neurological: Negative for dizziness, focal weakness and weakness.  Endo/Heme/Allergies: Does not bruise/bleed easily.  Psychiatric/Behavioral: Negative for depression and memory loss. The patient is nervous/anxious (He still seems to be very incredulous about his diagnosis.). The patient does not have insomnia.   . I have reviewed and (if needed) personally updated the patient's problem list, medications, allergies, past medical and surgical history, social and family history.   PAST MEDICAL HISTORY   Past Medical History:  Diagnosis Date  . Cervical spine arthritis 2007   C6-7 hemidiscectomy  . Coronary artery disease 04/02/2020   Cardiac cath on 04/04/2020 for non-STEMI: mLAD ~70% & small-mod D1  60-65%, LCx CTO with L-L & R-L collateral filling OM (unable to cross); Large Dom RCA (ectatic with diffuse mild irregularties) --< RPDA & PRAV-PL mild-mod irregulatities. EF 50-55%, anterolateral-apical HK. EDP 10 mmHg   . COVID-19 virus infection 09/2019  . Diabetes mellitus type 2 with complications, uncontrolled ([place]) 03/2020   Diagnosed in setting of non-STEMI; A1c 10.4.  Marland Kitchen DJD (degenerative joint disease) of knee    Bilateral knees.  . Genital herpes    Has standing dose of Valtrex  . Hyperlipidemia   . Hypertension   . OSA on CPAP 2000  . Pulmonary nodules 03/2020   CTA-PE protocol (04/02/2020): No PE.  Lung nodules measuring up to 3 mm noted.  . Sleep apnea     PAST SURGICAL HISTORY   Past Surgical History:  Procedure Laterality Date  . CERVICAL SPINE SURGERY  2007   C6-7 hemidiscectomy  . LEFT HEART CATH AND CORONARY ANGIOGRAPHY  04/04/2020   Horald Chestnut, MD; [facility]: mLAD ~70% & (small-moderate) D1 60 to 70%; nondominant LCx mid vessel CTO prior to OM (OM fills via L-L and R-L collaterals); large-dominant RCA with  mild ectasia and diffuse irregularities.  Bifurcates into RPDA and RPL V-PL with mild to moderate irregularities.  EF 50 to 55%.  Anterolateral-apical HK.  LVEDP 10 mmHg.  Marland Kitchen TRANSTHORACIC ECHOCARDIOGRAM  04/03/2020    ([facility]) normal LV size and thickness.  EF 55 to 60%.  Possible mid apical lateral HK.  Not well visualized RV.  Otherwise normal valves.    Immunization History  Administered Date(s) Administered  . Zoster 06/23/2013  . Zoster Recombinat (Shingrix) 09/10/2013    MEDICATIONS/ALLERGIES   Current Meds  Medication Sig  . aspirin 81 MG EC tablet Take 81 mg by mouth.   Marland Kitchen atorvastatin (LIPITOR) 80 MG tablet Take 80 mg by mouth.  . Blood Glucose Monitoring Suppl (ONE TOUCH ULTRA 2) w/Device KIT 2 (two) times daily.  . cetirizine (ZYRTEC) 10 MG tablet Take by mouth.    . empagliflozin (JARDIANCE) 10 MG TABS tablet Take 1 tablet (10 mg total) by mouth daily before breakfast.  . glipiZIDE (GLUCOTROL XL) 10 MG 24 hr tablet Take 1 tablet (10 mg total) by mouth daily.  . isosorbide mononitrate (IMDUR) 30 MG 24 hr tablet Take 30 mg by mouth daily.  . nitroGLYCERIN (NITROSTAT) 0.4 MG SL tablet SMARTSIG:1 Tablet(s) Sublingual PRN  . ONETOUCH ULTRA test strip daily.  . ticagrelor (BRILINTA) 90 MG TABS tablet Take 1 tablet (90 mg total) by mouth 2 (two) times daily.  . valACYclovir (VALTREX) 1000 MG tablet Take 1,000 mg by mouth daily.  . valsartan (DIOVAN) 40 MG tablet Take 1 tablet (40 mg total) by mouth daily.    No Known Allergies  SOCIAL HISTORY/FAMILY HISTORY   Reviewed in Epic:  Pertinent findings: Has pretty much completed a relatively "ugly "divorce.  Very frustrated about the financial repercussions of the divorce feeling as though he did not get a fair share because his lawyer did not do his job correctly.  Still has some lingering present.  OBJCTIVE -PE, EKG, labs   Wt Readings from Last 3 Encounters:  07/29/20 233 lb 9.6 oz (106 kg)  06/30/20 236 lb 4.8 oz (107.2 kg)  06/03/20 235 lb (106.6 kg)    Physical Exam: BP (!) 125/93   Pulse 89   Temp (!) 97.2 F (36.2 C)   Ht _0  (1.854 m)   Wt 233 lb 9.6 oz (106 kg)   SpO2 96%   BMI 30.82 kg/m  Physical Exam Constitutional:      General: He is not in acute distress.    Appearance: Normal appearance. He is obese. He is not ill-appearing or toxic-appearing.     Comments: Well-groomed.  Well-nourished  HENT:     Head: Normocephalic and atraumatic.  Neck:     Vascular: No carotid bruit.  Cardiovascular:     Rate and Rhythm: Normal rate and regular rhythm.     Pulses: Normal pulses.     Heart sounds: Normal heart sounds. No murmur heard.  No friction rub. No gallop.      Comments: Somewhat distant heart sounds Pulmonary:     Effort: Pulmonary effort is normal. No respiratory distress.      Breath sounds: Normal breath sounds.  Chest:     Chest wall: No tenderness.  Musculoskeletal:        General: No swelling. Normal range of motion.     Cervical back: Normal range of motion and neck supple.  Skin:    General: Skin is warm and dry.  Neurological:  General: No focal deficit present.     Mental Status: He is alert and oriented to person, place, and time.  Psychiatric:        Mood and Affect: Mood normal.        Behavior: Behavior normal.        Thought Content: Thought content normal.        Judgment: Judgment normal.     Comments: Still seems somewhat incredulous     Adult ECG Report N/A  Recent Labs: Reviewed Lab Results  Component Value Date   CHOL 122 07/14/2020   HDL 31 (L) 07/14/2020   LDLCALC 56 07/14/2020   TRIG 214 (H) 07/14/2020   CHOLHDL 3.9 07/14/2020   Lab Results  Component Value Date   CREATININE 1.30 (H) 07/14/2020   BUN 17 07/14/2020   NA 138 07/14/2020   K 4.8 07/14/2020   CL 98 07/14/2020   CO2 25 07/14/2020   Lab Results  Component Value Date   TSH 2.910 07/14/2020   Lab Results  Component Value Date   HGBA1C 7.3 (H) 07/14/2020    ASSESSMENT/PLAN    Problem List Items Addressed This Visit    Coronary artery disease involving native coronary artery of native heart with angina pectoris ([place]) - Primary (Chronic)    Cardiac cath report does suggest three-vessel disease.  Indeed it did appear that the circumflex-OM was not able to be crossed, but the LAD and diagonal lesions did not seem to be overly concerning and consistent with severe disease.  I do think you the easily approachable from a percutaneous approach if indicated, however with him not having any active anginal symptoms I do not know that PCI is warranted.  He is currently on Imdur as antianginal medication, not able to tolerate beta-blocker.  Only on low-dose valsartan for blood pressure. He is on DAPT because of recent non-STEMI which is appropriate.  (May  be having some issues with dyspnea related to Brilinta)  At this point, I think we need to answer the question as to whether those lesions are truly ischemic or not.  He is far out from his infarct that the circumflex distribution lesion should be a fixed perfusion defect.  Plan:  Exercise Stress Myoview -> would like to see if he has symptoms during stress, and imaging for evaluation of ischemia.  Continue current dose of Imdur and losartan  On statin, but allowing for 2-week statin holiday -> depending on symptom relief, would need to go back to 40 mg atorvastatin versus switch to 20 mg rosuvastatin.  Continue Imdur and DAPT for now   Continue supplemental caffeine to help with dyspnea from Brilinta.  If it worsens, would probably switch to either Effient or Plavix.  Okay to hold aspirin for significant bleeding or bruising.    After 6 months of uninterrupted therapy, with no stent in place, would be okay to hold Thienopyridine 5 days preop -> this would be as of February 2022.      Relevant Orders   Lipid panel   Comprehensive metabolic panel   MYOCARDIAL PERFUSION IMAGING   Cardiac Stress Test: Informed Consent Details: Physician/Practitioner Attestation; Transcribe to consent form and obtain patient signature   Hyperlipidemia with target LDL less than 70 (Chronic)    Follow-up lipids showed LDL level of 56.  This is on the reduced dose now of 40 mg atorvastatin.  He is still having some symptoms of fatigue in the legs and myalgia type symptoms.  Legs hurt  with walking.  I would like to see if this could be related to statin.  He is now beyond 3 months from his MI.  Plan: 2-week statin holiday.  If symptoms improved, we will switch him to rosuvastatin 20 mg. ->  He will need follow-up lipids in roughly 3 to 4 months, that can be checked in follow-up..      Relevant Orders   Lipid panel   Comprehensive metabolic panel   Type 2 diabetes mellitus with complication, without  long-term current use of insulin (HCC) (Chronic)    Notable improvement of his A1c from 10 down to 7.3.  He is on glipizide and Jardiance.  Vania Rea is definitely appropriate in the setting of known CAD.      Relevant Orders   Lipid panel   Comprehensive metabolic panel   Essential hypertension (Chronic)    Diastolic pressure is little high today, but otherwise looks pretty good.  He is doing better with reduced dose of Diovan cannot be on beta-blocker.  We added Imdur.  For now, continue to monitor.  I do want him to use his vague symptoms with any other changes in medications at this point.      Fatigue due to treatment    Notably improved.  One last potential culprit could be the statin especially with some leg aches.  We will do a brief statin holiday to see how he feels.  If no change, he can go back to 40 mg of atorvastatin, however if symptoms do improve, would switch him to rosuvastatin 20 mg for less side effects.      Relevant Orders   Lipid panel   Comprehensive metabolic panel   MYOCARDIAL PERFUSION IMAGING   Cardiac Stress Test: Informed Consent Details: Physician/Practitioner Attestation; Transcribe to consent form and obtain patient signature      COVID-19 Education: The signs and symptoms of COVID-19 were discussed with the patient and how to seek care for testing (follow up with PCP or arrange E-visit).   The importance of social distancing and COVID-19 vaccination was discussed today. The patient is practicing social distancing & Masking.   I spent a total of 46 minutes with the patient spent in direct patient consultation.  Additional time spent with chart review  / charting (studies, outside notes, etc): 12 Total Time: 56mn   Current medicines are reviewed at length with the patient today.  (+/- concerns) n/a  This visit occurred during the SARS-CoV-2 public health emergency.  Safety protocols were in place, including screening questions prior to the visit,  additional usage of staff PPE, and extensive cleaning of exam room while observing appropriate contact time as indicated for disinfecting solutions.  Notice: This dictation was prepared with Dragon dictation along with smaller phrase technology. Any transcriptional errors that result from this process are unintentional and may not be corrected upon review.  Patient Instructions / Medication Changes & Studies & Tests Ordered   Patient Instructions  Medication Instructions:    please do statin holiday for 2 -3 weeks  - if symptoms improve in the 3 week  , contact office will switch your  medication  Possible Rosuvastatin 20 mg  Daily    *If you need a refill on your cardiac medications before your next appointment, please call your pharmacy*   Lab Work: Not needed   Testing/Procedures:  [place] you have en exercise stress myoview. Please follow instruction sheet,  as given. You may need to have covid test 3 days  prior to test ,  Scheduler will give you the details  You will need a COVID-19  test prior to your procedure. You are scheduled for ----- at AM/PM. This is a Drive Up Visit at [address] [place], [place] [address]. Someone will direct you to the appropriate testing line. Stay in your car and someone will be with you shortly.  Follow-Up: At [facility], you and your health needs are our priority.  As part of our continuing mission to provide you with exceptional heart care, we have created designated Provider Care Teams.  These Care Teams include your primary Cardiologist (physician) and Advanced Practice Providers (APPs -  Physician Assistants and Nurse Practitioners) who all work together to provide you with the care you need, when you need it.     Your next appointment:   4 month(s)  The format for your next appointment:   In Person  Provider:   Glenetta Hew, MD   Other  Instructions   Studies Ordered:   Orders Placed This Encounter  Procedures  . Lipid panel  . Comprehensive metabolic panel  . Cardiac Stress Test: Informed Consent Details: Physician/Practitioner Attestation; Transcribe to consent form and obtain patient signature  . MYOCARDIAL PERFUSION IMAGING     Glenetta Hew, M.D., M.S. Interventional Cardiologist   Pager # [phone] Phone # [phone] [address] [place], [place] [address]   Thank you for choosing Heartcare at [facility]!!

## 2020-07-31 ENCOUNTER — Encounter: Payer: Self-pay | Admitting: Cardiology

## 2020-07-31 NOTE — Assessment & Plan Note (Signed)
Notable improvement of his A1c from 10 down to 7.3.  He is on glipizide and Jardiance.  London Pepper is definitely appropriate in the setting of known CAD.

## 2020-07-31 NOTE — Assessment & Plan Note (Addendum)
Follow-up lipids showed LDL level of 56.  This is on the reduced dose now of 40 mg atorvastatin.  He is still having some symptoms of fatigue in the legs and myalgia type symptoms.  Legs hurt with walking.  I would like to see if this could be related to statin.  He is now beyond 3 months from his MI.  Plan: 2-week statin holiday.  If symptoms improved, we will switch him to rosuvastatin 20 mg. ->  He will need follow-up lipids in roughly 3 to 4 months, that can be checked in follow-up.Marland Kitchen

## 2020-07-31 NOTE — Assessment & Plan Note (Signed)
Diastolic pressure is little high today, but otherwise looks pretty good.  He is doing better with reduced dose of Diovan cannot be on beta-blocker.  We added Imdur.  For now, continue to monitor.  I do want him to use his vague symptoms with any other changes in medications at this point.

## 2020-07-31 NOTE — Assessment & Plan Note (Addendum)
Cardiac cath report does suggest three-vessel disease.  Indeed it did appear that the circumflex-OM was not able to be crossed, but the LAD and diagonal lesions did not seem to be overly concerning and consistent with severe disease.  I do think you the easily approachable from a percutaneous approach if indicated, however with him not having any active anginal symptoms I do not know that PCI is warranted.  He is currently on Imdur as antianginal medication, not able to tolerate beta-blocker.  Only on low-dose valsartan for blood pressure. He is on DAPT because of recent non-STEMI which is appropriate.  (May be having some issues with dyspnea related to Brilinta)  At this point, I think we need to answer the question as to whether those lesions are truly ischemic or not.  He is far out from his infarct that the circumflex distribution lesion should be a fixed perfusion defect.  Plan:  Exercise Stress Myoview -> would like to see if he has symptoms during stress, and imaging for evaluation of ischemia.  Continue current dose of Imdur and losartan  On statin, but allowing for 2-week statin holiday -> depending on symptom relief, would need to go back to 40 mg atorvastatin versus switch to 20 mg rosuvastatin.  Continue Imdur and DAPT for now   Continue supplemental caffeine to help with dyspnea from Brilinta.  If it worsens, would probably switch to either Effient or Plavix.  Okay to hold aspirin for significant bleeding or bruising.    After 6 months of uninterrupted therapy, with no stent in place, would be okay to hold Thienopyridine 5 days preop -> this would be as of February 2022.

## 2020-07-31 NOTE — Assessment & Plan Note (Signed)
Notably improved.  One last potential culprit could be the statin especially with some leg aches.  We will do a brief statin holiday to see how he feels.  If no change, he can go back to 40 mg of atorvastatin, however if symptoms do improve, would switch him to rosuvastatin 20 mg for less side effects.

## 2020-08-01 ENCOUNTER — Other Ambulatory Visit (HOSPITAL_COMMUNITY): Payer: Self-pay | Admitting: Cardiology

## 2020-08-14 ENCOUNTER — Other Ambulatory Visit: Payer: Self-pay | Admitting: Family Medicine

## 2020-08-30 NOTE — Telephone Encounter (Signed)
I'm assuming he is referring to Brilinta cost increase (he's also on Jardiance).  Sounds like he is in the donut hole, so price will drop back to $45/month on January 1.  Any chance he has enough to last him until then?  If not, we may have samples to last him the next 10 days.  MD has to determine whether they are willing to switch patient to clopidogrel.

## 2020-09-19 ENCOUNTER — Encounter: Payer: Self-pay | Admitting: Family Medicine

## 2020-10-03 ENCOUNTER — Ambulatory Visit (INDEPENDENT_AMBULATORY_CARE_PROVIDER_SITE_OTHER): Payer: PPO | Admitting: Family Medicine

## 2020-10-03 ENCOUNTER — Other Ambulatory Visit: Payer: Self-pay

## 2020-10-03 ENCOUNTER — Encounter: Payer: Self-pay | Admitting: Family Medicine

## 2020-10-03 VITALS — BP 156/80 | HR 70 | Wt 235.6 lb

## 2020-10-03 DIAGNOSIS — I25119 Atherosclerotic heart disease of native coronary artery with unspecified angina pectoris: Secondary | ICD-10-CM | POA: Diagnosis not present

## 2020-10-03 DIAGNOSIS — G4733 Obstructive sleep apnea (adult) (pediatric): Secondary | ICD-10-CM

## 2020-10-03 DIAGNOSIS — E118 Type 2 diabetes mellitus with unspecified complications: Secondary | ICD-10-CM

## 2020-10-03 DIAGNOSIS — A609 Anogenital herpesviral infection, unspecified: Secondary | ICD-10-CM | POA: Diagnosis not present

## 2020-10-03 MED ORDER — EMPAGLIFLOZIN 10 MG PO TABS: ORAL_TABLET | ORAL | 1 refills | Status: DC

## 2020-10-03 MED ORDER — ONETOUCH ULTRA VI STRP: ORAL_STRIP | 3 refills | Status: DC

## 2020-10-03 MED ORDER — VALACYCLOVIR HCL 1 G PO TABS: 1000.0000 mg | ORAL_TABLET | Freq: Every day | ORAL | 3 refills | Status: DC

## 2020-10-03 NOTE — Assessment & Plan Note (Signed)
Controlling with valtrex, will continue at 1g daily.  Rx refilled.

## 2020-10-03 NOTE — Patient Instructions (Signed)
Continue isosorbide and valsartan for now.  Schedule with Dr. Karie Schwalbe for the knees Have A1c checked in about 1 month

## 2020-10-03 NOTE — Progress Notes (Signed)
Ryan [name] - 70 y.o. male MRN [id number]  Date of birth: 1951/07/06  Subjective Chief Complaint  Patient presents with  . Diabetes    HPI Ryan [name] is a 70 y.o. male with history of CAD s/p MI, T2DM, HLD and HTN here today for follow up visit.  He reports that he is doing well.   He is wanting to minimize medications and wants to discontinue imdur and valsartan. Beta blocker discontinued previously due to fatigue.  He was started on Brilinta at visit with cardiology in November.  He denies anginal symptoms.  He was having quite a bit of dyspnea with exertional activities/stairs but this has improved.    He is doing well with glipizide and jardiance for management of diabetes.  Readings no higher than 130 and has had several readings in the upper 90's.  He is concerned about costs of jardiance, currently paying $45/30 days.  He did apply for low income subsidy but did not qualify for this.    He needs refill of valtrex for HSV suppression.  This is working well for him.    Having some knee pain, he will plan to see Dr. Karie Schwalbe for this. Had steroid injections previously but didn't find these to be very helpful.   ROS:  A comprehensive ROS was completed and negative except as noted per HPI  No Known Allergies  Past Medical History:  Diagnosis Date  . Cervical spine arthritis 2007   C6-7 hemidiscectomy  . Coronary artery disease 04/02/2020   Cardiac cath on 04/04/2020 for non-STEMI: mLAD ~70% & small-mod D1 60-65%, LCx CTO with L-L & R-L collateral filling OM (unable to cross); Large Dom RCA (ectatic with diffuse mild irregularties) --< RPDA & PRAV-PL mild-mod irregulatities. EF 50-55%, anterolateral-apical HK. EDP 10 mmHg   . COVID-19 virus infection 09/2019  . 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  Marland Kitchen DJD (degenerative joint disease) of knee    Bilateral knees.  . Genital herpes    Has standing dose of  Valtrex  . Hyperlipidemia   . Hypertension   . OSA on CPAP 2000  . Pulmonary nodules 03/2020   CTA-PE protocol (04/02/2020): No PE.  Lung nodules measuring up to 3 mm noted.  . Sleep apnea     Past Surgical History:  Procedure Laterality Date  . CERVICAL SPINE SURGERY  2007   C6-7 hemidiscectomy  . LEFT HEART CATH AND CORONARY ANGIOGRAPHY  04/04/2020   Lucrezia Starch, MD; [facility]: mLAD ~70% & (small-moderate) D1 60 to 70%; nondominant LCx mid vessel CTO prior to OM (OM fills via L-L and R-L collaterals); large-dominant RCA with mild ectasia and diffuse irregularities.  Bifurcates into RPDA and RPL V-PL with mild to moderate irregularities.  EF 50 to 55%.  Anterolateral-apical HK.  LVEDP 10 mmHg.  Marland Kitchen TRANSTHORACIC ECHOCARDIOGRAM  04/03/2020    [facility]) normal LV size and thickness.  EF 55 to 60%.  Possible mid apical lateral HK.  Not well visualized RV.  Otherwise normal valves.    Social History   Socioeconomic History  . Marital status: Divorced    Spouse name: Not on file  . Number of children: Not on file  . Years of education: Not on file  . Highest education level: Not on file  Occupational History  . Occupation: Retired  Tobacco Use  . Smoking status: Never Smoker  .  Smokeless tobacco: Never Used  Vaping Use  . Vaping Use: Never used  Substance and Sexual Activity  . Alcohol use: Yes    Alcohol/week: 3.0 standard drinks    Types: 3 Standard drinks or equivalent per week    Comment: Occasional social  . Drug use: Never  . Sexual activity: Yes    Partners: Female  Other Topics Concern  . Not on file  Social History Narrative   He is currently completing a long very stressful and somewhat mean-spirited divorce.  Has been under a lot of stress with transition of property etc.  He is just wanting to make a full clean break.      Prior to his MI, he was very active walking 30 minutes most  days of the week.   Social Determinants of Health   Financial Resource Strain: Not on file  Food Insecurity: Not on file  Transportation Needs: Not on file  Physical Activity: Not on file  Stress: Not on file  Social Connections: Not on file    Family History  Problem Relation Age of Onset  . Diabetes Father   . Diabetes Other        Several people in the family have diabetes, but no noted CAD.    Health Maintenance  Topic Date Due  . Hepatitis C Screening  Never done  . OPHTHALMOLOGY EXAM  Never done  . TETANUS/TDAP  Never done  . COLONOSCOPY (Pts 45-40yrs Insurance coverage will need to be confirmed)  Never done  . PNA vac Low Risk Adult (1 of 2 - PCV13) Never done  . COVID-19 Vaccine (3 - Booster for Pfizer series) 07/20/2020  . INFLUENZA VACCINE  12/08/2020 (Originally 04/10/2020)  . HEMOGLOBIN A1C  01/11/2021  . FOOT EXAM  06/30/2021     ----------------------------------------------------------------------------------------------------------------------------------------------------------------------------------------------------------------- Physical Exam BP (!) 156/80 (BP Location: Right Arm, Patient Position: Sitting, Cuff Size: Large)   Pulse 70   Wt 235 lb 9.6 oz (106.9 kg)   SpO2 97%   BMI 31.08 kg/m   Physical Exam Constitutional:      Appearance: Normal appearance.  HENT:     Head: Normocephalic and atraumatic.  Eyes:     General: No scleral icterus. Cardiovascular:     Rate and Rhythm: Normal rate and regular rhythm.  Pulmonary:     Effort: Pulmonary effort is normal.     Breath sounds: Normal breath sounds.  Neurological:     General: No focal deficit present.     Mental Status: He is alert.  Psychiatric:        Mood and Affect: Mood normal.        Behavior: Behavior normal.      ------------------------------------------------------------------------------------------------------------------------------------------------------------------------------------------------------------------- Assessment and Plan  Coronary artery disease involving native coronary artery of native heart with angina pectoris [facility]) He has upcoming stress myoview and f/u with cardiology in March.  I recommended that he remain on imdur and valsartan for now.  He can discuss further with cardiology at follow up.  He will continue on DAPT for now.    Type 2 diabetes mellitus with complication, without long-term current use of insulin (HCC) Little early for f/u of A1c, ordered for next month.  Home readings have been good.  Continue jardiance and glipizide for now.    HSV (herpes simplex virus) anogenital infection Controlling with valtrex, will continue at 1g daily.  Rx refilled.    Meds ordered this encounter  Medications  . empagliflozin (JARDIANCE) 10 MG TABS tablet  Sig: TAKE 1 TABLET BY MOUTH DAILY BEFORE BREAKFAST.    Dispense:  90 tablet    Refill:  1  . valACYclovir (VALTREX) 1000 MG tablet    Sig: Take 1 tablet (1,000 mg total) by mouth daily.    Dispense:  90 tablet    Refill:  3  . glucose blood (ONETOUCH ULTRA) test strip    Sig: Use to check blood glucose daily.    Dispense:  100 each    Refill:  3    Return in about 4 months (around 01/31/2021) for HTN/DM.    This visit occurred during the SARS-CoV-2 public health emergency.  Safety protocols were in place, including screening questions prior to the visit, additional usage of staff PPE, and extensive cleaning of exam room while observing appropriate contact time as indicated for disinfecting solutions.

## 2020-10-03 NOTE — Assessment & Plan Note (Signed)
He has upcoming stress myoview and f/u with cardiology in March.  I recommended that he remain on imdur and valsartan for now.  He can discuss further with cardiology at follow up.  He will continue on DAPT for now.

## 2020-10-03 NOTE — Assessment & Plan Note (Signed)
Little early for f/u of A1c, ordered for next month.  Home readings have been good.  Continue jardiance and glipizide for now.

## 2020-10-11 ENCOUNTER — Ambulatory Visit (INDEPENDENT_AMBULATORY_CARE_PROVIDER_SITE_OTHER): Payer: PPO

## 2020-10-11 ENCOUNTER — Other Ambulatory Visit: Payer: Self-pay

## 2020-10-11 ENCOUNTER — Ambulatory Visit (INDEPENDENT_AMBULATORY_CARE_PROVIDER_SITE_OTHER): Payer: PPO | Admitting: Sports Medicine

## 2020-10-11 DIAGNOSIS — M25562 Pain in left knee: Secondary | ICD-10-CM | POA: Diagnosis not present

## 2020-10-11 DIAGNOSIS — M25561 Pain in right knee: Secondary | ICD-10-CM

## 2020-10-11 DIAGNOSIS — G8929 Other chronic pain: Secondary | ICD-10-CM | POA: Diagnosis not present

## 2020-10-11 DIAGNOSIS — M25461 Effusion, right knee: Secondary | ICD-10-CM

## 2020-10-11 DIAGNOSIS — I708 Atherosclerosis of other arteries: Secondary | ICD-10-CM | POA: Diagnosis not present

## 2020-10-11 DIAGNOSIS — M25462 Effusion, left knee: Secondary | ICD-10-CM | POA: Diagnosis not present

## 2020-10-11 DIAGNOSIS — M1711 Unilateral primary osteoarthritis, right knee: Secondary | ICD-10-CM | POA: Diagnosis not present

## 2020-10-11 DIAGNOSIS — M1712 Unilateral primary osteoarthritis, left knee: Secondary | ICD-10-CM | POA: Diagnosis not present

## 2020-10-11 NOTE — Progress Notes (Signed)
    Procedures performed today:    None.  Independent interpretation of notes and tests performed by another provider:   None.  Brief History, Exam, Impression, and Recommendations:    Bilateral knee pain Ryan [name] has bilateral knee pain, left worse than right, medial joint line, he has had x-rays ago for orthopedics, steroid injection, years of physical therapy but continues to have discomfort, moderate gelling. He likely has some osteoarthritis, he does have pain with terminal flexion and a positive McMurray's sign on the left worrisome for meniscal tear, due to failure of conservative treatment we are going to get updated x-rays, as well as a left knee MRI, arthroscopy if meniscal tear, Orthovisc if simple osteoarthritis, declines medication. Return to see me to go over MRI results.    ___________________________________________ Ihor Austin. Benjamin Stain, M.D., ABFM., CAQSM. Primary Care and Sports Medicine [facility]  Adjunct Instructor of Family Medicine  [employer]

## 2020-10-11 NOTE — Assessment & Plan Note (Signed)
Ryan [name] has bilateral knee pain, left worse than right, medial joint line, he has had x-rays ago for orthopedics, steroid injection, years of physical therapy but continues to have discomfort, moderate gelling. He likely has some osteoarthritis, he does have pain with terminal flexion and a positive McMurray's sign on the left worrisome for meniscal tear, due to failure of conservative treatment we are going to get updated x-rays, as well as a left knee MRI, arthroscopy if meniscal tear, Orthovisc if simple osteoarthritis, declines medication. Return to see me to go over MRI results.

## 2020-10-16 ENCOUNTER — Ambulatory Visit (INDEPENDENT_AMBULATORY_CARE_PROVIDER_SITE_OTHER): Payer: PPO

## 2020-10-16 ENCOUNTER — Other Ambulatory Visit: Payer: Self-pay

## 2020-10-16 DIAGNOSIS — M25562 Pain in left knee: Secondary | ICD-10-CM

## 2020-10-16 DIAGNOSIS — M67462 Ganglion, left knee: Secondary | ICD-10-CM | POA: Diagnosis not present

## 2020-10-16 DIAGNOSIS — G8929 Other chronic pain: Secondary | ICD-10-CM | POA: Diagnosis not present

## 2020-10-16 DIAGNOSIS — M25462 Effusion, left knee: Secondary | ICD-10-CM

## 2020-10-16 DIAGNOSIS — S83232A Complex tear of medial meniscus, current injury, left knee, initial encounter: Secondary | ICD-10-CM | POA: Diagnosis not present

## 2020-10-16 DIAGNOSIS — M7052 Other bursitis of knee, left knee: Secondary | ICD-10-CM | POA: Diagnosis not present

## 2020-10-16 DIAGNOSIS — S83242A Other tear of medial meniscus, current injury, left knee, initial encounter: Secondary | ICD-10-CM

## 2020-10-16 DIAGNOSIS — M1712 Unilateral primary osteoarthritis, left knee: Secondary | ICD-10-CM | POA: Diagnosis not present

## 2020-10-16 DIAGNOSIS — M179 Osteoarthritis of knee, unspecified: Secondary | ICD-10-CM

## 2020-10-16 DIAGNOSIS — S83012A Lateral subluxation of left patella, initial encounter: Secondary | ICD-10-CM | POA: Diagnosis not present

## 2020-10-16 DIAGNOSIS — M67362 Transient synovitis, left knee: Secondary | ICD-10-CM | POA: Diagnosis not present

## 2020-10-17 DIAGNOSIS — G8929 Other chronic pain: Secondary | ICD-10-CM

## 2020-10-24 DIAGNOSIS — M170 Bilateral primary osteoarthritis of knee: Secondary | ICD-10-CM | POA: Diagnosis not present

## 2020-10-27 ENCOUNTER — Encounter: Payer: Self-pay | Admitting: Neurology

## 2020-10-27 ENCOUNTER — Ambulatory Visit: Payer: PPO | Admitting: Neurology

## 2020-10-27 VITALS — BP 132/84 | HR 61 | Ht 73.0 in | Wt 237.0 lb

## 2020-10-27 DIAGNOSIS — I214 Non-ST elevation (NSTEMI) myocardial infarction: Secondary | ICD-10-CM | POA: Diagnosis not present

## 2020-10-27 DIAGNOSIS — G4733 Obstructive sleep apnea (adult) (pediatric): Secondary | ICD-10-CM | POA: Diagnosis not present

## 2020-10-27 DIAGNOSIS — Z9989 Dependence on other enabling machines and devices: Secondary | ICD-10-CM | POA: Diagnosis not present

## 2020-10-27 NOTE — Patient Instructions (Signed)
Screening for Sleep Apnea  Sleep apnea is a condition in which breathing pauses or becomes shallow during sleep. Sleep apnea screening is a test to determine if you are at risk for sleep apnea. The test is easy and only takes a few minutes. Your health care provider may ask you to have this test in preparation for surgery or as part of a physical exam. What are the symptoms of sleep apnea? Common symptoms of sleep apnea include:  Snoring.  Restless sleep.  Daytime sleepiness.  Pauses in breathing.  Choking during sleep.  Irritability.  Forgetfulness.  Trouble thinking clearly.  Depression.  Personality changes. Most people with sleep apnea are not aware that they have it. Why should I get screened? Getting screened for sleep apnea can help:  Ensure your safety. It is important for your health care providers to know whether or not you have sleep apnea, especially if you are having surgery or have other long-term (chronic) health conditions.  Improve your health and allow you to get a better night's rest. Restful sleep can help you: ? Have more energy. ? Lose weight. ? Improve high blood pressure. ? Improve diabetes management. ? Prevent stroke. ? Prevent car accidents. How is screening done? Screening usually includes being asked a list of questions about your sleep quality. Some questions you may be asked include:  Do you snore?  Is your sleep restless?  Do you have daytime sleepiness?  Has a partner or spouse told you that you stop breathing during sleep?  Have you had trouble concentrating or memory loss? If your screening test is positive, you are at risk for the condition. Further testing may be needed to confirm a diagnosis of sleep apnea. Where to find more information You can find screening tools online or at your health care clinic. For more information about sleep apnea screening and healthy sleep, visit these websites:  Centers for Disease Control and  Prevention: www.cdc.gov/sleep/index.html  American Sleep Apnea Association: www.sleepapnea.org Contact a health care provider if:  You think that you may have sleep apnea. Summary  Sleep apnea screening can help determine if you are at risk for sleep apnea.  It is important for your health care providers to know whether or not you have sleep apnea, especially if you are having surgery or have other chronic health conditions.  You may be asked to take a screening test for sleep apnea in preparation for surgery or as part of a physical exam. This information is not intended to replace advice given to you by your health care provider. Make sure you discuss any questions you have with your health care provider. Document Revised: 06/13/2018 Document Reviewed: 12/07/2016 Elsevier Patient Education  2021 Elsevier Inc. Quality Sleep Information, Adult Quality sleep is important for your mental and physical health. It also improves your quality of life. Quality sleep means you:  Are asleep for most of the time you are in bed.  Fall asleep within 30 minutes.  Wake up no more than once a night.  Are awake for no longer than 20 minutes if you do wake up during the night. Most adults need 7-8 hours of quality sleep each night. How can poor sleep affect me? If you do not get enough quality sleep, you may have:  Mood swings.  Daytime sleepiness.  Confusion.  Decreased reaction time.  Sleep disorders, such as insomnia and sleep apnea.  Difficulty with: ? Solving problems. ? Coping with stress. ? Paying attention. These issues may   affect your performance and productivity at work, school, and at home. Lack of sleep may also put you at higher risk for accidents, suicide, and risky behaviors. If you do not get quality sleep you may also be at higher risk for several health problems, including:  Infections.  Type 2 diabetes.  Heart disease.  High blood  pressure.  Obesity.  Worsening of long-term conditions, like arthritis, kidney disease, depression, Parkinson's disease, and epilepsy. What actions can I take to get more quality sleep?  Stick to a sleep schedule. Go to sleep and wake up at about the same time each day. Do not try to sleep less on weekdays and make up for lost sleep on weekends. This does not work.  Try to get about 30 minutes of exercise on most days. Do not exercise 2-3 hours before going to bed.  Limit naps during the day to 30 minutes or less.  Do not use any products that contain nicotine or tobacco, such as cigarettes or e-cigarettes. If you need help quitting, ask your health care provider.  Do not drink caffeinated beverages for at least 8 hours before going to bed. Coffee, tea, and some sodas contain caffeine.  Do not drink alcohol close to bedtime.  Do not eat large meals close to bedtime.  Do not take naps in the late afternoon.  Try to get at least 30 minutes of sunlight every day. Morning sunlight is best.  Make time to relax before bed. Reading, listening to music, or taking a hot bath promotes quality sleep.  Make your bedroom a place that promotes quality sleep. Keep your bedroom dark, quiet, and at a comfortable room temperature. Make sure your bed is comfortable. Take out sleep distractions like TV, a computer, smartphone, and bright lights.  If you are lying awake in bed for longer than 20 minutes, get up and do a relaxing activity until you feel sleepy.  Work with your health care provider to treat medical conditions that may affect sleeping, such as: ? Nasal obstruction. ? Snoring. ? Sleep apnea and other sleep disorders.  Talk to your health care provider if you think any of your prescription medicines may cause you to have difficulty falling or staying asleep.  If you have sleep problems, talk with a sleep consultant. If you think you have a sleep disorder, talk with your health care  provider about getting evaluated by a specialist.      Where to find more information  National Sleep Foundation website: https://sleepfoundation.org  National Heart, Lung, and Blood Institute (NHLBI): www.nhlbi.nih.gov/files/docs/public/sleep/healthy_sleep.pdf  Centers for Disease Control and Prevention (CDC): www.cdc.gov/sleep/index.html Contact a health care provider if you:  Have trouble getting to sleep or staying asleep.  Often wake up very early in the morning and cannot get back to sleep.  Have daytime sleepiness.  Have daytime sleep attacks of suddenly falling asleep and sudden muscle weakness (narcolepsy).  Have a tingling sensation in your legs with a strong urge to move your legs (restless legs syndrome).  Stop breathing briefly during sleep (sleep apnea).  Think you have a sleep disorder or are taking a medicine that is affecting your quality of sleep. Summary  Most adults need 7-8 hours of quality sleep each night.  Getting enough quality sleep is an important part of health and well-being.  Make your bedroom a place that promotes quality sleep and avoid things that may cause you to have poor sleep, such as alcohol, caffeine, smoking, and large meals.  Talk   to your health care provider if you have trouble falling asleep or staying asleep. This information is not intended to replace advice given to you by your health care provider. Make sure you discuss any questions you have with your health care provider. Document Revised: 12/04/2017 Document Reviewed: 12/04/2017 Elsevier Patient Education  2021 Elsevier Inc.  

## 2020-10-27 NOTE — Progress Notes (Signed)
SLEEP MEDICINE CLINIC    Provider:  Larey Seat, MD  Primary Care Physician:  Luetta Nutting, [address] [place]  [place] [address]     Referring Provider: Luetta Nutting, [address] [place],  [place] [address]          Chief Complaint according to patient   Patient presents with:    . New Patient (Initial Visit)     New patient- rm 10 he is a chronic CPAP user and considers himself CPAP dependent. .He brought in a PSG from 2000. DME [facility] but he purchases online through [employer] which is cheaper. He is interested in getting a new machine.      HISTORY OF PRESENT ILLNESS:  Ryan [name] is a 70 year- old Caucasian male patient  and seen hereupon NP referral on 10/27/2020 from Dr. Zigmund Daniel.  Chief concern according to patient :     Ryan [name]  has a past medical history of Cervical spine arthritis (2007), Coronary artery disease (04/02/2020), MI in 2020,  COVID-19 virus infection (09/2019), Diabetes mellitus type 2 with complications, uncontrolled ([place]) (03/2020), DJD (degenerative joint disease) of knee,  Hyperlipidemia, Hypertension, OSA on CPAP (2000), Pulmonary nodules (03/2020). Fully vaccinated for COVID.    The patient had the first sleep study in the year 2000 it was actually in May 21, 2019 he had undergone a sleep study at the sleep disorders center associated with [facility] : Interpreted by Dr. Baird Lyons.   It was a split-night protocol that was invoked because his AHI was 60/h which is a very severe degree of apnea he had only mild oxygen desaturation but he collected the 60 apneas and 1.1-hour of sleep.  Very loud snoring was noted there was a normal cardiac rhythm but variability of the heart rate was great CPAP was only titrated to 7 cmH2O at the time resolving all apnea he was given a soft gel mask and medium size . Air leaks wake him up, he uses a nasal soft gel ResMed mask in large  size. He did not like nasal pillows or FFM. He is reluctant to use a chin strap. He can't sleep supine.   Family medical /sleep history: No other family member on CPAP with OSA, insomnia, sleep walkers.    Social history:  Patient is working still as a Hotel manager but plans to retire, he is divorced  and lives alone, he has no children. The patient currently still works. Tobacco use: none .   ETOH use ; not often. Caffeine intake in form of green Tea .Regular exercise in form of recumbent bike/ some walking  Hobbies : restoring cars, owns 12 ! Sammuel Cooper.    Sleep habits are as follows: The patient's dinner time is between 6 PM. The patient goes to bed at 10 PM and continues to sleep for 7 hours, wakes for 1-2 bathroom breaks, the first time at 3 AM.   The preferred sleep position is supine , but he can't sleep flat, with the support of 1-2 pillows.  Dreams are reportedly frequent. 6-7 AM is the usual rise time. The patient wakes up spontaneously.  He reports usually feeling refreshed and restored in AM.   Review of Systems: Out of a complete 14 system review, the patient complains of only the following symptoms, and all other reviewed systems are negative.:  Fatigue, sleepiness , snoring only if sleeping flat or  not using CPAP-, unfragmented sleep,    How likely are you to doze in the following situations: 0 = not likely, 1 = slight chance, 2 = moderate chance, 3 = high chance   Sitting and Reading? Watching Television? Sitting inactive in a public place (theater or meeting)? As a passenger in a car for an hour without a break? Lying down in the afternoon when circumstances permit? Sitting and talking to someone? Sitting quietly after lunch without alcohol? In a car, while stopped for a few minutes in traffic?   Total = 0/ 24 points   FSS endorsed at 32/ 63 points.   Social History   Socioeconomic History  . Marital status: Divorced    Spouse name: Not on file  . Number of children:  Not on file  . Years of education: Not on file  . Highest education level: Not on file  Occupational History  . Occupation: Retired  Tobacco Use  . Smoking status: Never Smoker  . Smokeless tobacco: Never Used  Vaping Use  . Vaping Use: Never used  Substance and Sexual Activity  . Alcohol use: Yes    Alcohol/week: 3.0 standard drinks    Types: 3 Standard drinks or equivalent per week    Comment: Occasional social  . Drug use: Never  . Sexual activity: Yes    Partners: Female  Other Topics Concern  . Not on file  Social History Narrative   He is currently completing a long very stressful and somewhat mean-spirited divorce.  Has been under a lot of stress with transition of property etc.  He is just wanting to make a full clean break.      Prior to his MI, he was very active walking 30 minutes most days of the week.   Social Determinants of Health   Financial Resource Strain: Not on file  Food Insecurity: Not on file  Transportation Needs: Not on file  Physical Activity: Not on file  Stress: Not on file  Social Connections: Not on file    Family History  Problem Relation Age of Onset  . Diabetes Father   . Diabetes Other        Several people in the family have diabetes, but no noted CAD.    Past Medical History:  Diagnosis Date  . Cervical spine arthritis 2007   C6-7 hemidiscectomy  . Coronary artery disease 04/02/2020   Cardiac cath on 04/04/2020 for non-STEMI: mLAD ~70% & small-mod D1 60-65%, LCx CTO with L-L & R-L collateral filling OM (unable to cross); Large Dom RCA (ectatic with diffuse mild irregularties) --< RPDA & PRAV-PL mild-mod irregulatities. EF 50-55%, anterolateral-apical HK. EDP 10 mmHg   . COVID-19 virus infection 09/2019  . Diabetes mellitus type 2 with complications, uncontrolled ([place]) 03/2020   Diagnosed in setting of non-STEMI; A1c 10.4.  Marland Kitchen DJD (degenerative joint disease) of knee    Bilateral knees.  . Genital herpes    Has standing dose of  Valtrex  . Hyperlipidemia   . Hypertension   . OSA on CPAP 2000  . Pulmonary nodules 03/2020   CTA-PE protocol (04/02/2020): No PE.  Lung nodules measuring up to 3 mm noted.  . Sleep apnea     Past Surgical History:  Procedure Laterality Date  . CERVICAL SPINE SURGERY  2007   C6-7 hemidiscectomy  . LEFT HEART CATH AND CORONARY ANGIOGRAPHY  04/04/2020   Horald Chestnut, MD; [facility]: mLAD ~70% & (small-moderate) D1 60 to  70%; nondominant LCx mid vessel CTO prior to OM (OM fills via L-L and R-L collaterals); large-dominant RCA with mild ectasia and diffuse irregularities.  Bifurcates into RPDA and RPL V-PL with mild to moderate irregularities.  EF 50 to 55%.  Anterolateral-apical HK.  LVEDP 10 mmHg.  Marland Kitchen TRANSTHORACIC ECHOCARDIOGRAM  04/03/2020    ([facility]) normal LV size and thickness.  EF 55 to 60%.  Possible mid apical lateral HK.  Not well visualized RV.  Otherwise normal valves.     Current Outpatient Medications on File Prior to Visit  Medication Sig Dispense Refill  . aspirin 81 MG EC tablet Take 81 mg by mouth.     . Blood Glucose Monitoring Suppl (ONE TOUCH ULTRA 2) w/Device KIT 2 (two) times daily.    . cetirizine (ZYRTEC) 10 MG tablet Take by mouth.    . empagliflozin (JARDIANCE) 10 MG TABS tablet TAKE 1 TABLET BY MOUTH DAILY BEFORE BREAKFAST. 90 tablet 1  . glipiZIDE (GLUCOTROL XL) 10 MG 24 hr tablet Take 1 tablet (10 mg total) by mouth daily. 90 tablet 1  . glucose blood (ONETOUCH ULTRA) test strip Use to check blood glucose daily. 100 each 3  . isosorbide mononitrate (IMDUR) 30 MG 24 hr tablet Take 30 mg by mouth daily.    . nitroGLYCERIN (NITROSTAT) 0.4 MG SL tablet SMARTSIG:1 Tablet(s) Sublingual PRN    . ticagrelor (BRILINTA) 90 MG TABS tablet Take 1 tablet (90 mg total) by mouth 2 (two) times daily. 16 tablet 0  . valACYclovir (VALTREX) 1000 MG tablet Take 1 tablet (1,000 mg total) by mouth daily.  90 tablet 3  . valsartan (DIOVAN) 40 MG tablet Take 1 tablet (40 mg total) by mouth daily. 90 tablet 3   No current facility-administered medications on file prior to visit.    No Known Allergies  Physical exam:  Today's Vitals   10/27/20 1244  BP: 132/84  Pulse: 61  Weight: 237 lb (107.5 kg)  Height: '6\' 1"'  (1.854 m)   Body mass index is 31.27 kg/m.   Wt Readings from Last 3 Encounters:  10/27/20 237 lb (107.5 kg)  10/03/20 235 lb 9.6 oz (106.9 kg)  07/29/20 233 lb 9.6 oz (106 kg)     Ht Readings from Last 3 Encounters:  10/27/20 '6\' 1"'  (1.854 m)  07/29/20 '6\' 1"'  (1.854 m)  06/30/20 5' 11.26" (1.81 m)      General: The patient is awake, alert and appears not in acute distress. The patient is well groomed. Head: Normocephalic, atraumatic. Neck is supple. Mallampati 2 plus  ,  neck circumference 19.5  inches .  Nasal airflow patent.  Retrognathia is not seen,but a small mouth, crowded jaw. .  Dental status: intact. Cardiovascular:  Regular rate and cardiac rhythm by pulse,  without distended neck veins. Respiratory: Lungs are clear to auscultation.  Skin:  Without evidence of ankle edema, or rash. Trunk: The patient's posture is erect.   Neurologic exam : The patient is awake and alert, oriented to place and time.   Memory subjective described as intact.  Attention span & concentration ability appears normal.  Speech is fluent,  without  dysarthria, dysphonia or aphasia.  Mood and affect are appropriate.   Cranial nerves: no loss of smell or taste reported  Pupils are equal and briskly reactive to light. Funduscopic exam deferred.  Extraocular movements in vertical and horizontal planes were intact and without nystagmus. No Diplopia. Visual fields by finger perimetry are intact.  Hearing was intact to soft voice and finger rubbing. Facial sensation intact to fine touch. Facial motor strength is symmetric and tongue and uvula move midline.  Neck ROM : rotation, tilt  and flexion extension were normal for age and shoulder shrug was symmetrical.    Motor exam:  Symmetric bulk, tone and ROM.   Normal tone without cog- wheeling, symmetric grip strength .   Sensory:  deferred.   Coordination: Rapid alternating movements in the fingers/hands were of normal speed.  Gait and station: Patient could rise unassisted from a seated position, walked without assistive device. E has knee pain and has trouble walking a long distances.  Stance is of normal width/ base .  Toe and heel walk were deferred.  Deep tendon reflexes: in the  upper and lower extremities are symmetric and intact.  Babinski response was deferred.      After spending a total time of 40 minutes face to face and additional time for physical and neurologic examination, review of laboratory studies,  personal review of imaging studies, reports and results of other testing and review of referral information / records as far as provided in visit, I have established the following assessments:   1) Clearly CPAP dependent , with a history of SEVERE OSA. Had a recent heart attack, Summer 2021.   He has been a highly compliant CPAP user and he considers himself CPAP dependent.  What we should do is obtain a baseline which should be just a home sleep test I do not need the patient to come to the sleep lab and then based on the data order an auto titration CPAP which an older pilot program but basically gives a range of pressure allowing the patient at night to automatically be adjusted to the pressure needs related to positional changes etc.  I hope that we can finish that work-up was in the months.    My Plan is to proceed with:  1) HST or PSG for new baseline.  2) likes his current mask and he is eager to use an autotitration device.  3)   I would like to thank Luetta Nutting, DO and Luetta Nutting, Do [address] [place],  [place] [address] for allowing me to meet with and to take  care of this pleasant patient.    I plan to follow up either personally or through our NP within 3 month.   CC: I will share my notes with PCP.  Electronically signed by: Larey Seat, MD 10/27/2020 1:06 PM  [facility] and [facility] Board certified by The [employer] of Sleep Medicine and Diplomate of the [employer] of Sleep Medicine. Board certified In Neurology through the [place], Fellow of the [employer] of Neurology. Medical Director of [employer] [employer].

## 2020-11-03 ENCOUNTER — Telehealth: Payer: Self-pay

## 2020-11-03 NOTE — Telephone Encounter (Signed)
LVM for pt to call me back to schedule sleep study  

## 2020-11-11 DIAGNOSIS — M25562 Pain in left knee: Secondary | ICD-10-CM | POA: Diagnosis not present

## 2020-11-11 DIAGNOSIS — M25662 Stiffness of left knee, not elsewhere classified: Secondary | ICD-10-CM | POA: Diagnosis not present

## 2020-11-11 DIAGNOSIS — M7052 Other bursitis of knee, left knee: Secondary | ICD-10-CM | POA: Diagnosis not present

## 2020-11-11 DIAGNOSIS — M1712 Unilateral primary osteoarthritis, left knee: Secondary | ICD-10-CM | POA: Diagnosis not present

## 2020-11-14 ENCOUNTER — Encounter: Payer: Self-pay | Admitting: Family Medicine

## 2020-11-21 ENCOUNTER — Ambulatory Visit (INDEPENDENT_AMBULATORY_CARE_PROVIDER_SITE_OTHER): Payer: PPO | Admitting: Neurology

## 2020-11-21 DIAGNOSIS — G4733 Obstructive sleep apnea (adult) (pediatric): Secondary | ICD-10-CM

## 2020-11-21 DIAGNOSIS — Z9989 Dependence on other enabling machines and devices: Secondary | ICD-10-CM

## 2020-11-21 DIAGNOSIS — I214 Non-ST elevation (NSTEMI) myocardial infarction: Secondary | ICD-10-CM

## 2020-11-24 NOTE — Progress Notes (Signed)
   [facility]  HOME SLEEP TEST (Watch PAT)  STUDY DATA: 11/24/20  DOB: 01-23-1951  MRN: [id number]  ORDERING CLINICIAN: Melvyn Novas, MD   REFERRING CLINICIAN: Everrett Coombe, DO   CLINICAL INFORMATION/HISTORY:  Ryan [name] has a past medical history of Cervical spine arthritis (2007), Coronary artery disease (04/02/2020), MI in 2020, COVID-19 virus infection (09/2019), Diabetes mellitus type 2 with complications, uncontrolled ([facility]) (03/2020), DJD (degenerative joint disease) of knee,  Hyperlipidemia, Hypertension, OSA on CPAP (2000), Pulmonary nodules (03/2020). Fully vaccinated for COVID.  He is considered CPAP dependent and need for a new machine. He has had a heart attack within the last 8 month.    Epworth sleepiness score: 0/24.  BMI: 31.6 kg/m  Neck Circumference: 19.5"  FINDINGS:   Total Record Time (hours, min): 3 h 2 min Total Sleep Time (hours, min):  2 h 39 min   Percent REM (%):    N/A   Calculated pAHI (per hour):  17.3      REM pAHI: N/A  NREM pAHI: N/A Supine AHI: 17.3   Oxygen Saturation (%) Mean: 94  Minimum oxygen saturation (%):        84   O2 Saturation Range (%): 84-98  O2Saturation (minutes) <=88%: 0.6 min   Pulse Mean (bpm):    72  Pulse Range (51-100)   IMPRESSION: This HST confirmed the presence of mild-moderate OSA (obstructive sleep apnea), all sleep recorded  in supine. Primary snoring is presumed , based on RDI 23/h. There was no evidence of sleep related hypoxemia.   RECOMMENDATION:  This HST recorded only 2.5 hours of sleep, and we were unable to see REM sleep versus NREM sleep apnea. Based on these limited data, a diagnosis of moderate sleep apnea can be made. The patient confirmed that he can hardly sleep without CPAP.   I will order an autotitration device with a setting from 6-16 cm water, 2 cm EPR , heated humidification. He would like to continue use of  A nasal soft gel mask , ResMed, in medium size       INTERPRETING PHYSICIAN:  Melvyn Novas, MD  [facility] and [facility] Sleep Board certified by The [employer] of Sleep Medicine and Fellow of the [employer] of Neurology. Medical Director of [employer].

## 2020-11-30 DIAGNOSIS — G4733 Obstructive sleep apnea (adult) (pediatric): Secondary | ICD-10-CM | POA: Insufficient documentation

## 2020-11-30 DIAGNOSIS — Z9989 Dependence on other enabling machines and devices: Secondary | ICD-10-CM | POA: Insufficient documentation

## 2020-11-30 NOTE — Progress Notes (Signed)
IMPRESSION: This HST confirmed the presence of mild-moderate OSA (obstructive sleep apnea), all sleep recorded  in supine. Primary snoring is presumed , based on RDI 23/h. There was no evidence of sleep related hypoxemia.   RECOMMENDATION:  This HST recorded only 2.5 hours of sleep, and we were unable to see REM sleep versus NREM sleep apnea. Based on these limited data, a diagnosis of moderate sleep apnea can be made. The patient confirmed that he can hardly sleep without CPAP.   I will order an autotitration device with a setting from 6-16 cm water, 2 cm EPR , heated humidification. He would like to continue use of  A nasal soft gel mask , ResMed, in medium size

## 2020-11-30 NOTE — Procedures (Signed)
[facility]  HOME SLEEP TEST (Watch PAT)  STUDY DATA: 11/24/20  DOB: [birth year]  MRN: [id number]  ORDERING CLINICIAN: Melvyn Novas, MD   REFERRING CLINICIAN: Everrett Coombe, DO   CLINICAL INFORMATION/HISTORY:  Ryan [name] has a past medical history of Cervical spine arthritis (2007), Coronary artery disease (04/02/2020), MI in 2020, COVID-19 virus infection (09/2019), Diabetes mellitus type 2 with complications, uncontrolled ([facility]) (03/2020), DJD (degenerative joint disease) of knee,  Hyperlipidemia, Hypertension, OSA on CPAP (2000), Pulmonary nodules (03/2020). Fully vaccinated for COVID.  He is considered CPAP dependent and need for a new machine. He has had a heart attack within the last 8 month.    Epworth sleepiness score: 0/24.  BMI: 31.6 kg/m  Neck Circumference: 19.5"  FINDINGS:   Total Record Time (hours, min): 3 h 2 min Total Sleep Time (hours, min):  2 h 39 min   Percent REM (%):    N/A   Calculated pAHI (per hour):  17.3      REM pAHI: N/A  NREM pAHI: N/A Supine AHI: 17.3   Oxygen Saturation (%) Mean: 94  Minimum oxygen saturation (%):        84   O2 Saturation Range (%): 84-98  O2Saturation (minutes) <=88%: 0.6 min   Pulse Mean (bpm):    72  Pulse Range (51-100)   IMPRESSION: This HST confirmed the presence of mild-moderate OSA (obstructive sleep apnea), all sleep recorded  in supine. Primary snoring is presumed , based on RDI 23/h. There was no evidence of sleep related hypoxemia.   RECOMMENDATION:  This HST recorded only 2.5 hours of sleep, and we were unable to see REM sleep versus NREM sleep apnea. Based on these limited data, a diagnosis of moderate sleep apnea can be made. The patient confirmed that he can hardly sleep without CPAP.   I will order an autotitration device with a setting from 6-16 cm water, 2 cm EPR , heated humidification. He would like to continue use of  A nasal soft gel mask , ResMed, in medium size      INTERPRETING  PHYSICIAN:  Melvyn Novas, MD  [facility] and [facility] Sleep Board certified by The [employer] of Sleep Medicine and Fellow of the [employer] of Neurology. Medical Director of [facility].

## 2020-11-30 NOTE — Addendum Note (Signed)
Addended by: Melvyn Novas on: 11/30/2020 05:54 PM   Modules accepted: Orders

## 2020-12-01 ENCOUNTER — Encounter: Payer: Self-pay | Admitting: Neurology

## 2020-12-01 ENCOUNTER — Telehealth: Payer: Self-pay | Admitting: Neurology

## 2020-12-01 NOTE — Telephone Encounter (Signed)
-----   Message from Melvyn Novas, MD sent at 11/30/2020  5:54 PM EDT ----- IMPRESSION: This HST confirmed the presence of mild-moderate OSA (obstructive sleep apnea), all sleep recorded  in supine. Primary snoring is presumed , based on RDI 23/h. There was no evidence of sleep related hypoxemia.   RECOMMENDATION:  This HST recorded only 2.5 hours of sleep, and we were unable to see REM sleep versus NREM sleep apnea. Based on these limited data, a diagnosis of moderate sleep apnea can be made. The patient confirmed that he can hardly sleep without CPAP.   I will order an autotitration device with a setting from 6-16 cm water, 2 cm EPR , heated humidification. He would like to continue use of  A nasal soft gel mask , ResMed, in medium size

## 2020-12-01 NOTE — Telephone Encounter (Signed)
I called pt. I advised pt that Dr. Vickey Huger reviewed their sleep study results and found that pt sleep apnea. Dr. Vickey Huger recommends that pt starts auto CPAP. I reviewed PAP compliance expectations with the pt. Pt is agreeable to starting a CPAP. I advised pt that an order will be sent to a DME, [facility] and [facility] will call the pt within about one week after they file with the pt's insurance. [facility]) will show the pt how to use the machine, fit for masks, and troubleshoot the CPAP if needed. A follow up appt will need to be made for insurance purposes with Dr. Vickey Huger or the NP within 31-90 days from the date that he picks up the machine. Pt verbalized understanding to call and schedule this. A letter with all of this information in it will be sent to the pt as a reminder through my chart. Pt verbalized understanding of results. Pt had no questions at this time but was encouraged to call back if questions arise. I have sent the order to [facility]) and have received confirmation that they have received the order.

## 2020-12-31 DIAGNOSIS — G4733 Obstructive sleep apnea (adult) (pediatric): Secondary | ICD-10-CM | POA: Diagnosis not present

## 2021-01-30 DIAGNOSIS — G4733 Obstructive sleep apnea (adult) (pediatric): Secondary | ICD-10-CM | POA: Diagnosis not present

## 2021-01-31 ENCOUNTER — Encounter: Payer: Self-pay | Admitting: Neurology

## 2021-02-07 ENCOUNTER — Other Ambulatory Visit: Payer: Self-pay | Admitting: Family Medicine

## 2021-02-07 DIAGNOSIS — G4733 Obstructive sleep apnea (adult) (pediatric): Secondary | ICD-10-CM | POA: Diagnosis not present

## 2021-03-02 DIAGNOSIS — G4733 Obstructive sleep apnea (adult) (pediatric): Secondary | ICD-10-CM | POA: Diagnosis not present

## 2021-03-07 ENCOUNTER — Ambulatory Visit: Payer: PPO | Admitting: Neurology

## 2021-03-07 ENCOUNTER — Ambulatory Visit: Payer: PPO | Admitting: Family Medicine

## 2021-03-07 ENCOUNTER — Encounter: Payer: Self-pay | Admitting: Family Medicine

## 2021-03-07 VITALS — BP 129/80 | HR 83 | Ht 73.0 in | Wt 247.0 lb

## 2021-03-07 DIAGNOSIS — G4733 Obstructive sleep apnea (adult) (pediatric): Secondary | ICD-10-CM | POA: Diagnosis not present

## 2021-03-07 DIAGNOSIS — Z9989 Dependence on other enabling machines and devices: Secondary | ICD-10-CM | POA: Diagnosis not present

## 2021-03-07 NOTE — Progress Notes (Signed)
PATIENT: Ryan [name] DOB: 1951/03/03  REASON FOR VISIT: follow up HISTORY FROM: patient  Chief Complaint  Patient presents with   Obstructive Sleep Apnea    Rm 2, alone. Here for cpap f/u, pt reports doing well on his new cpap machine. Pt express bad customer service from DME.       HISTORY OF PRESENT ILLNESS: 03/07/21 ALL:  Ryan [name] is a 70 y.o. male here today for follow up for OSA on CPAP. He was diagnosed with OSA in 2020 and has used CPAP since. He recently received a new CPAP machine. HST confirmed mild OSA with AHI of 17.3/hr. he is doing very well with his new machine. He did have some difficulty with DME communication. He did not receive a mask when CPAP was set up. He has had a difficult time but has recently received a new mask and headgear. He is currently using a nasal mask.     HISTORY: (copied from previous note)   REVIEW OF SYSTEMS: Out of a complete 14 system review of symptoms, the patient complains only of the following symptoms, none and all other reviewed systems are negative.  ESS: 3   ALLERGIES: No Known Allergies  HOME MEDICATIONS: Outpatient Medications Prior to Visit  Medication Sig Dispense Refill   aspirin 81 MG EC tablet Take 81 mg by mouth.      Blood Glucose Monitoring Suppl (ONE TOUCH ULTRA 2) w/Device KIT 2 (two) times daily.     cetirizine (ZYRTEC) 10 MG tablet Take by mouth.     glipiZIDE (GLUCOTROL XL) 10 MG 24 hr tablet TAKE ONE TABLET BY MOUTH EVERY DAY 90 tablet 0   glucose blood (ONETOUCH ULTRA) test strip Use to check blood glucose daily. 100 each 3   isosorbide mononitrate (IMDUR) 30 MG 24 hr tablet Take 30 mg by mouth daily.     nitroGLYCERIN (NITROSTAT) 0.4 MG SL tablet SMARTSIG:1 Tablet(s) Sublingual PRN     ticagrelor (BRILINTA) 90 MG TABS tablet Take 1 tablet (90 mg total) by mouth 2 (two) times daily. 16 tablet 0   valACYclovir (VALTREX) 1000 MG tablet Take 1 tablet (1,000 mg total) by mouth daily. 90 tablet 3    valsartan (DIOVAN) 40 MG tablet Take 1 tablet (40 mg total) by mouth daily. 90 tablet 3   empagliflozin (JARDIANCE) 10 MG TABS tablet TAKE 1 TABLET BY MOUTH DAILY BEFORE BREAKFAST. 90 tablet 1   No facility-administered medications prior to visit.    PAST MEDICAL HISTORY: Past Medical History:  Diagnosis Date   Cervical spine arthritis 2007   C6-7 hemidiscectomy   Coronary artery disease 04/02/2020   Cardiac cath on 04/04/2020 for non-STEMI: mLAD ~70% & small-mod D1 60-65%, LCx CTO with L-L & R-L collateral filling OM (unable to cross); Large Dom RCA (ectatic with diffuse mild irregularties) --< RPDA & PRAV-PL mild-mod irregulatities. EF 50-55%, anterolateral-apical HK. EDP 10 mmHg    COVID-19 virus infection 09/2019   Diabetes mellitus type 2 with complications, uncontrolled ([place]) 03/2020   Diagnosed in setting of non-STEMI; A1c 10.4.   DJD (degenerative joint disease) of knee    Bilateral knees.   Genital herpes    Has standing dose of Valtrex   Hyperlipidemia    Hypertension    OSA on CPAP 2000   Pulmonary nodules 03/2020   CTA-PE protocol (04/02/2020): No PE.  Lung nodules measuring up to 3 mm noted.   Sleep apnea     PAST SURGICAL HISTORY: Past  Surgical History:  Procedure Laterality Date   CERVICAL SPINE SURGERY  2007   C6-7 hemidiscectomy   LEFT HEART CATH AND CORONARY ANGIOGRAPHY  04/04/2020   Horald Chestnut, MD; [facility]: mLAD ~70% & (small-moderate) D1 60 to 70%; nondominant LCx mid vessel CTO prior to OM (OM fills via L-L and R-L collaterals); large-dominant RCA with mild ectasia and diffuse irregularities.  Bifurcates into RPDA and RPL V-PL with mild to moderate irregularities.  EF 50 to 55%.  Anterolateral-apical HK.  LVEDP 10 mmHg.   TRANSTHORACIC ECHOCARDIOGRAM  04/03/2020    ([facility]) normal LV size and thickness.  EF 55 to 60%.  Possible mid apical lateral HK.  Not well visualized RV.   Otherwise normal valves.    FAMILY HISTORY: Family History  Problem Relation Age of Onset   Diabetes Father    Diabetes Other        Several people in the family have diabetes, but no noted CAD.    SOCIAL HISTORY: Social History   Socioeconomic History   Marital status: Divorced    Spouse name: Not on file   Number of children: Not on file   Years of education: Not on file   Highest education level: Not on file  Occupational History   Occupation: Retired  Tobacco Use   Smoking status: Never   Smokeless tobacco: Never  Vaping Use   Vaping Use: Never used  Substance and Sexual Activity   Alcohol use: Yes    Alcohol/week: 3.0 standard drinks    Types: 3 Standard drinks or equivalent per week    Comment: Occasional social   Drug use: Never   Sexual activity: Yes    Partners: Female  Other Topics Concern   Not on file  Social History Narrative   He is currently completing a long very stressful and somewhat mean-spirited divorce.  Has been under a lot of stress with transition of property etc.  He is just wanting to make a full clean break.      Prior to his MI, he was very active walking 30 minutes most days of the week.   Social Determinants of Health   Financial Resource Strain: Not on file  Food Insecurity: Not on file  Transportation Needs: Not on file  Physical Activity: Not on file  Stress: Not on file  Social Connections: Not on file  Intimate Partner Violence: Not on file     PHYSICAL EXAM  Vitals:   03/07/21 0827  BP: 129/80  Pulse: 83  Weight: 247 lb (112 kg)  Height: '6\' 1"'  (1.854 m)   Body mass index is 32.59 kg/m.  Generalized: Well developed, in no acute distress  Cardiology: normal rate and rhythm, no murmur noted Respiratory: clear to auscultation bilaterally  Neurological examination  Mentation: Alert oriented to time, place, history taking. Follows all commands speech and language fluent Cranial nerve II-XII: Pupils were equal round  reactive to light. Extraocular movements were full, visual field were full  Motor: The motor testing reveals 5 over 5 strength of all 4 extremities. Good symmetric motor tone is noted throughout.  Gait and station: Gait is normal.    DIAGNOSTIC DATA (LABS, IMAGING, TESTING) - I reviewed patient records, labs, notes, testing and imaging myself where available.  No flowsheet data found.   Lab Results  Component Value Date   WBC 9.9 07/14/2020   HGB 16.0 07/14/2020   HCT 46.8 07/14/2020   MCV 97  07/14/2020   PLT 244 07/14/2020      Component Value Date/Time   NA 138 07/14/2020 0858   K 4.8 07/14/2020 0858   CL 98 07/14/2020 0858   CO2 25 07/14/2020 0858   GLUCOSE 127 (H) 07/14/2020 0858   BUN 17 07/14/2020 0858   CREATININE 1.30 (H) 07/14/2020 0858   CALCIUM 9.6 07/14/2020 0858   PROT 7.4 07/14/2020 0858   ALBUMIN 4.6 07/14/2020 0858   AST 33 07/14/2020 0858   ALT 34 07/14/2020 0858   ALKPHOS 59 07/14/2020 0858   BILITOT 0.7 07/14/2020 0858   GFRNONAA 56 (L) 07/14/2020 0858   GFRAA 65 07/14/2020 0858   Lab Results  Component Value Date   CHOL 122 07/14/2020   HDL 31 (L) 07/14/2020   LDLCALC 56 07/14/2020   TRIG 214 (H) 07/14/2020   CHOLHDL 3.9 07/14/2020   Lab Results  Component Value Date   HGBA1C 7.3 (H) 07/14/2020   No results found for: [id number] Lab Results  Component Value Date   TSH 2.910 07/14/2020     ASSESSMENT AND PLAN 70 y.o. year old male  has a past medical history of Cervical spine arthritis (2007), Coronary artery disease (04/02/2020), COVID-19 virus infection (09/2019), Diabetes mellitus type 2 with complications, uncontrolled ([place]) (03/2020), DJD (degenerative joint disease) of knee, Genital herpes, Hyperlipidemia, Hypertension, OSA on CPAP (2000), Pulmonary nodules (03/2020), and Sleep apnea. here with     ICD-10-CM   1. OSA on CPAP  G47.33    Z99.89         Mccade D Deveney is doing well on CPAP therapy. Compliance report reveals  excellent compliance. He was encouraged to continue using CPAP nightly and for greater than 4 hours each night. We will update supply orders as indicated. Risks of untreated sleep apnea review and education materials provided. Healthy lifestyle habits encouraged. He will follow up in 1 year, sooner if needed. He verbalizes understanding and agreement with this plan.    No orders of the defined types were placed in this encounter.    No orders of the defined types were placed in this encounter.     Debbora Presto, FNP-C 03/07/2021, 8:35 AM [facility] [address], [place], [place] [address] [phone]

## 2021-03-07 NOTE — Patient Instructions (Signed)

## 2021-03-10 NOTE — Progress Notes (Signed)
I am happy to see the good numeric outcome of the CPAP therapy- very low leak and low AHI, and reduction in Epworth Sleepiness Score , too. I agree with the assessment and plan as directed by NP on this visit and will be happy to follow every third visit  . I was available for consultation.   Melvyn Novas, MD

## 2021-03-24 ENCOUNTER — Encounter: Payer: Self-pay | Admitting: Family Medicine

## 2021-03-30 ENCOUNTER — Encounter: Payer: Self-pay | Admitting: Family Medicine

## 2021-04-28 ENCOUNTER — Other Ambulatory Visit: Payer: Self-pay | Admitting: Cardiology

## 2021-05-02 DIAGNOSIS — G4733 Obstructive sleep apnea (adult) (pediatric): Secondary | ICD-10-CM | POA: Diagnosis not present

## 2021-05-09 ENCOUNTER — Ambulatory Visit (INDEPENDENT_AMBULATORY_CARE_PROVIDER_SITE_OTHER): Payer: PPO | Admitting: Family Medicine

## 2021-05-09 ENCOUNTER — Other Ambulatory Visit: Payer: Self-pay

## 2021-05-09 ENCOUNTER — Encounter: Payer: Self-pay | Admitting: Family Medicine

## 2021-05-09 VITALS — BP 170/71 | HR 84 | Temp 97.8°F | Ht 73.0 in | Wt 246.0 lb

## 2021-05-09 DIAGNOSIS — I25119 Atherosclerotic heart disease of native coronary artery with unspecified angina pectoris: Secondary | ICD-10-CM

## 2021-05-09 DIAGNOSIS — Z1211 Encounter for screening for malignant neoplasm of colon: Secondary | ICD-10-CM | POA: Diagnosis not present

## 2021-05-09 DIAGNOSIS — H02831 Dermatochalasis of right upper eyelid: Secondary | ICD-10-CM | POA: Diagnosis not present

## 2021-05-09 DIAGNOSIS — E118 Type 2 diabetes mellitus with unspecified complications: Secondary | ICD-10-CM

## 2021-05-09 DIAGNOSIS — I10 Essential (primary) hypertension: Secondary | ICD-10-CM

## 2021-05-09 DIAGNOSIS — E785 Hyperlipidemia, unspecified: Secondary | ICD-10-CM

## 2021-05-09 DIAGNOSIS — E119 Type 2 diabetes mellitus without complications: Secondary | ICD-10-CM | POA: Diagnosis not present

## 2021-05-09 DIAGNOSIS — H25813 Combined forms of age-related cataract, bilateral: Secondary | ICD-10-CM | POA: Diagnosis not present

## 2021-05-09 DIAGNOSIS — H02834 Dermatochalasis of left upper eyelid: Secondary | ICD-10-CM | POA: Diagnosis not present

## 2021-05-09 DIAGNOSIS — H527 Unspecified disorder of refraction: Secondary | ICD-10-CM | POA: Diagnosis not present

## 2021-05-09 LAB — HM DIABETES EYE EXAM

## 2021-05-09 MED ORDER — GLIPIZIDE ER 10 MG PO TB24: 10.0000 mg | ORAL_TABLET | Freq: Every day | ORAL | 1 refills | Status: DC

## 2021-05-09 NOTE — Patient Instructions (Addendum)
Great to see you today! We'll be in touch with labs.  Continue current medications Complete cologuard for colon cancer screening.

## 2021-05-10 LAB — CBC WITH DIFFERENTIAL/PLATELET
Absolute Monocytes: 926 cells/uL (ref 200–950)
Basophils Absolute: 80 cells/uL (ref 0–200)
Basophils Relative: 0.9 %
Eosinophils Absolute: 276 cells/uL (ref 15–500)
Eosinophils Relative: 3.1 %
HCT: 44.8 % (ref 38.5–50.0)
Hemoglobin: 15.6 g/dL (ref 13.2–17.1)
Lymphs Abs: 2759 cells/uL (ref 850–3900)
MCH: 34.1 pg — ABNORMAL HIGH (ref 27.0–33.0)
MCHC: 34.8 g/dL (ref 32.0–36.0)
MCV: 97.8 fL (ref 80.0–100.0)
MPV: 10.1 fL (ref 7.5–12.5)
Monocytes Relative: 10.4 %
Neutro Abs: 4859 cells/uL (ref 1500–7800)
Neutrophils Relative %: 54.6 %
Platelets: 257 10*3/uL (ref 140–400)
RBC: 4.58 10*6/uL (ref 4.20–5.80)
RDW: 12.6 % (ref 11.0–15.0)
Total Lymphocyte: 31 %
WBC: 8.9 10*3/uL (ref 3.8–10.8)

## 2021-05-10 LAB — COMPLETE METABOLIC PANEL WITH GFR
AG Ratio: 1.5 (calc) (ref 1.0–2.5)
ALT: 35 U/L (ref 9–46)
AST: 26 U/L (ref 10–35)
Albumin: 4.5 g/dL (ref 3.6–5.1)
Alkaline phosphatase (APISO): 44 U/L (ref 35–144)
BUN: 15 mg/dL (ref 7–25)
CO2: 27 mmol/L (ref 20–32)
Calcium: 9.6 mg/dL (ref 8.6–10.3)
Chloride: 101 mmol/L (ref 98–110)
Creat: 1.03 mg/dL (ref 0.70–1.35)
Globulin: 3 g/dL (calc) (ref 1.9–3.7)
Glucose, Bld: 179 mg/dL — ABNORMAL HIGH (ref 65–99)
Potassium: 4.6 mmol/L (ref 3.5–5.3)
Sodium: 138 mmol/L (ref 135–146)
Total Bilirubin: 0.5 mg/dL (ref 0.2–1.2)
Total Protein: 7.5 g/dL (ref 6.1–8.1)
eGFR: 79 mL/min/{1.73_m2} (ref >=60)

## 2021-05-10 LAB — HEMOGLOBIN A1C
Hgb A1c MFr Bld: 7.2 % of total Hgb — ABNORMAL HIGH (ref <5.7)
Mean Plasma Glucose: 160 mg/dL
eAG (mmol/L): 8.9 mmol/L

## 2021-05-10 LAB — LIPID PANEL W/REFLEX DIRECT LDL
Cholesterol: 229 mg/dL — ABNORMAL HIGH (ref <200)
HDL: 32 mg/dL — ABNORMAL LOW (ref >=40)
Non-HDL Cholesterol (Calc): 197 mg/dL (calc) — ABNORMAL HIGH (ref <130)
Total CHOL/HDL Ratio: 7.2 (calc) — ABNORMAL HIGH (ref <5.0)
Triglycerides: 888 mg/dL — ABNORMAL HIGH (ref <150)

## 2021-05-10 LAB — DIRECT LDL: Direct LDL: 96 mg/dL (ref <100)

## 2021-05-12 ENCOUNTER — Encounter: Payer: Self-pay | Admitting: Family Medicine

## 2021-05-12 DIAGNOSIS — Z1211 Encounter for screening for malignant neoplasm of colon: Secondary | ICD-10-CM | POA: Diagnosis not present

## 2021-05-15 NOTE — Assessment & Plan Note (Signed)
Updating lipid panel today.  Goal LDL is less than 70.

## 2021-05-15 NOTE — Assessment & Plan Note (Signed)
Blood pressure is elevated in clinic today.  Reported blood pressures at home are much better.  Asked that he bring a log of his blood pressure readings from home and/or his cuff from home so we can compare to readings in the clinic.  He will continue current medications in addition to a low-sodium diet.

## 2021-05-15 NOTE — Assessment & Plan Note (Signed)
Glipizide renewed.  Updating A1c today.  Low carbohydrate diet encouraged.

## 2021-05-15 NOTE — Progress Notes (Signed)
Ryan [name] - 70 y.o. male MRN [id number]  Date of birth: 29-Jun-1951  Subjective Chief Complaint  Patient presents with   Medication Refill    HPI Ryan [name] is a 70 year old male here today for follow-up visit.  He has a history of hypertension, type 2 diabetes and coronary artery disease status post MI.  He continues to see cardiology annually for history of MI.  Reports that he is feeling well and denies any anginal symptoms.  He feels like the divorce he is going through at that time contributed to this.  He continues on valsartan and Imdur for management of hypertension.  He is also taking aspirin 81 mg.  He is not currently taking a statin.  Diabetes is managed with glipizide 10 mg daily.  He does not check his blood sugars regularly.  He does stay pretty active.  He denies any symptoms of hypoglycemia.  ROS:  A comprehensive ROS was completed and negative except as noted per HPI  No Known Allergies  Past Medical History:  Diagnosis Date   Cervical spine arthritis 2007   C6-7 hemidiscectomy   Coronary artery disease 04/02/2020   Cardiac cath on 04/04/2020 for non-STEMI: mLAD ~70% & small-mod D1 60-65%, LCx CTO with L-L & R-L collateral filling OM (unable to cross); Large Dom RCA (ectatic with diffuse mild irregularties) --< RPDA & PRAV-PL mild-mod irregulatities. EF 50-55%, anterolateral-apical HK. EDP 10 mmHg    COVID-19 virus infection 09/2019   Diabetes mellitus type 2 with complications, uncontrolled (HCC) 03/2020   Diagnosed in setting of non-STEMI; A1c 10.4.   DJD (degenerative joint disease) of knee    Bilateral knees.   Genital herpes    Has standing dose of Valtrex   Hyperlipidemia    Hypertension    OSA on CPAP 2000   Pulmonary nodules 03/2020   CTA-PE protocol (04/02/2020): No PE.  Lung nodules measuring up to 3 mm noted.   Sleep apnea     Past Surgical History:  Procedure Laterality Date   CERVICAL SPINE SURGERY  2007   C6-7 hemidiscectomy   LEFT HEART CATH  AND CORONARY ANGIOGRAPHY  04/04/2020   Lucrezia Starch, MD; [facility]: mLAD ~70% & (small-moderate) D1 60 to 70%; nondominant LCx mid vessel CTO prior to OM (OM fills via L-L and R-L collaterals); large-dominant RCA with mild ectasia and diffuse irregularities.  Bifurcates into RPDA and RPL V-PL with mild to moderate irregularities.  EF 50 to 55%.  Anterolateral-apical HK.  LVEDP 10 mmHg.   TRANSTHORACIC ECHOCARDIOGRAM  04/03/2020    [facility]) normal LV size and thickness.  EF 55 to 60%.  Possible mid apical lateral HK.  Not well visualized RV.  Otherwise normal valves.    Social History   Socioeconomic History   Marital status: Divorced    Spouse name: Not on file   Number of children: Not on file   Years of education: Not on file   Highest education level: Not on file  Occupational History   Occupation: Retired  Tobacco Use   Smoking status: Never   Smokeless tobacco: Never  Vaping Use   Vaping Use: Never used  Substance and Sexual Activity   Alcohol use: Yes    Alcohol/week: 3.0 standard drinks    Types: 3 Standard drinks or equivalent per week    Comment: Occasional social   Drug use: Never   Sexual activity: Yes    Partners: Female  Other Topics Concern  Not on file  Social History Narrative   He is currently completing a long very stressful and somewhat mean-spirited divorce.  Has been under a lot of stress with transition of property etc.  He is just wanting to make a full clean break.      Prior to his MI, he was very active walking 30 minutes most days of the week.   Social Determinants of Health   Financial Resource Strain: Not on file  Food Insecurity: Not on file  Transportation Needs: Not on file  Physical Activity: Not on file  Stress: Not on file  Social Connections: Not on file    Family History  Problem Relation Age of Onset   Diabetes Father    Diabetes Other         Several people in the family have diabetes, but no noted CAD.    Health Maintenance  Topic Date Due   Hepatitis C Screening  Never done   COLONOSCOPY (Pts 45-50yrs Insurance coverage will need to be confirmed)  Never done   Zoster Vaccines- Shingrix (2 of 2) 11/05/2013   PNA vac Low Risk Adult (1 of 2 - PCV13) Never done   COVID-19 Vaccine (3 - Pfizer risk series) 02/15/2020   INFLUENZA VACCINE  12/08/2021 (Originally 04/10/2021)   TETANUS/TDAP  05/09/2022 (Originally 04/08/1971)   FOOT EXAM  06/30/2021   HEMOGLOBIN A1C  11/07/2021   OPHTHALMOLOGY EXAM  05/09/2022   HPV VACCINES  Aged Out     ----------------------------------------------------------------------------------------------------------------------------------------------------------------------------------------------------------------- Physical Exam BP (!) 170/71 (BP Location: Left Arm, Patient Position: Sitting, Cuff Size: Normal)   Pulse 84   Temp 97.8 F (36.6 C)   Ht 6\' 1"  (1.854 m)   Wt 246 lb (111.6 kg)   SpO2 97%   BMI 32.46 kg/m   Physical Exam Constitutional:      Appearance: Normal appearance.  Eyes:     General: No scleral icterus. Cardiovascular:     Rate and Rhythm: Normal rate and regular rhythm.  Pulmonary:     Effort: Pulmonary effort is normal.     Breath sounds: Normal breath sounds.  Musculoskeletal:     Cervical back: Neck supple.  Neurological:     General: No focal deficit present.     Mental Status: He is alert.  Psychiatric:        Mood and Affect: Mood normal.        Behavior: Behavior normal.    ------------------------------------------------------------------------------------------------------------------------------------------------------------------------------------------------------------------- Assessment and Plan  Essential hypertension Blood pressure is elevated in clinic today.  Reported blood pressures at home are much better.  Asked that he bring a log of his  blood pressure readings from home and/or his cuff from home so we can compare to readings in the clinic.  He will continue current medications in addition to a low-sodium diet.  Coronary artery disease involving native coronary artery of native heart with angina pectoris [facility]) Managed by cardiology.  No anginal symptoms at this time.  Hyperlipidemia with target LDL less than 70 Updating lipid panel today.  Goal LDL is less than 70.  Type 2 diabetes mellitus with complication, without long-term current use of insulin (HCC) Glipizide renewed.  Updating A1c today.  Low carbohydrate diet encouraged.   Meds ordered this encounter  Medications   glipiZIDE (GLUCOTROL XL) 10 MG 24 hr tablet    Sig: Take 1 tablet (10 mg total) by mouth daily.    Dispense:  90 tablet    Refill:  1  Return in about 6 months (around 11/07/2021) for HTN/T2DM.    This visit occurred during the SARS-CoV-2 public health emergency.  Safety protocols were in place, including screening questions prior to the visit, additional usage of staff PPE, and extensive cleaning of exam room while observing appropriate contact time as indicated for disinfecting solutions.

## 2021-05-15 NOTE — Assessment & Plan Note (Signed)
Managed by cardiology.  No anginal symptoms at this time.

## 2021-05-18 ENCOUNTER — Telehealth: Payer: Self-pay | Admitting: Family Medicine

## 2021-05-18 LAB — COLOGUARD: Cologuard: NEGATIVE

## 2021-05-18 NOTE — Telephone Encounter (Signed)
Left message for patient to call back and schedule Medicare Annual Wellness Visit (AWV) either virtually or in office.  I gave my number [phone]   Eilleen Kempf 03/10/18 per [facility]  please schedule at anytime with health coach

## 2021-06-02 DIAGNOSIS — G4733 Obstructive sleep apnea (adult) (pediatric): Secondary | ICD-10-CM | POA: Diagnosis not present

## 2021-07-02 DIAGNOSIS — G4733 Obstructive sleep apnea (adult) (pediatric): Secondary | ICD-10-CM | POA: Diagnosis not present

## 2021-07-18 DIAGNOSIS — H0288A Meibomian gland dysfunction right eye, upper and lower eyelids: Secondary | ICD-10-CM | POA: Diagnosis not present

## 2021-07-18 DIAGNOSIS — H43813 Vitreous degeneration, bilateral: Secondary | ICD-10-CM | POA: Diagnosis not present

## 2021-07-18 DIAGNOSIS — H02834 Dermatochalasis of left upper eyelid: Secondary | ICD-10-CM | POA: Diagnosis not present

## 2021-07-18 DIAGNOSIS — H02831 Dermatochalasis of right upper eyelid: Secondary | ICD-10-CM | POA: Diagnosis not present

## 2021-07-18 DIAGNOSIS — H25813 Combined forms of age-related cataract, bilateral: Secondary | ICD-10-CM | POA: Diagnosis not present

## 2021-07-18 DIAGNOSIS — H0288B Meibomian gland dysfunction left eye, upper and lower eyelids: Secondary | ICD-10-CM | POA: Diagnosis not present

## 2021-08-02 DIAGNOSIS — G4733 Obstructive sleep apnea (adult) (pediatric): Secondary | ICD-10-CM | POA: Diagnosis not present

## 2021-08-23 IMAGING — MR MR KNEE*L* W/O CM
7 series · 40 of 40 positions shown · non-contrast
Comparison: None.

CLINICAL DATA: Chronic knee pain

EXAM:
MRI OF THE LEFT KNEE WITHOUT CONTRAST
TECHNIQUE: Multiplanar, multisequence MR imaging of the knee was performed. No
intravenous contrast was administered.

[Series 6: T2 fat-sat · axial · 4.0mm · 0.53mm/px · z∈[-69,+76]mm · 6 of 30 slices shown (1 of 3)]
[im 1/30]
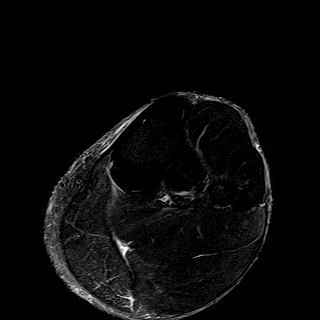
[im 6/30]
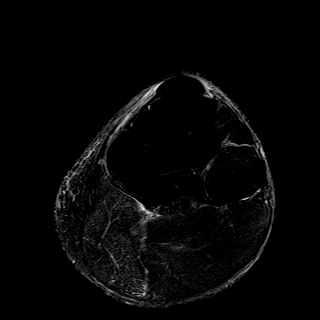
[im 12/30]
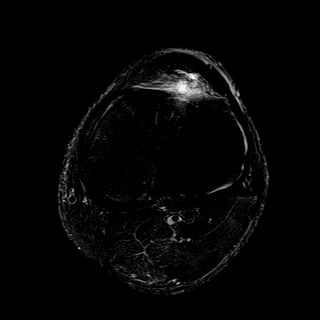
[im 18/30]
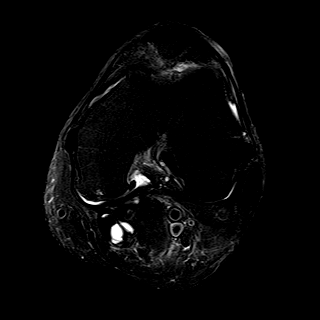
[im 24/30]
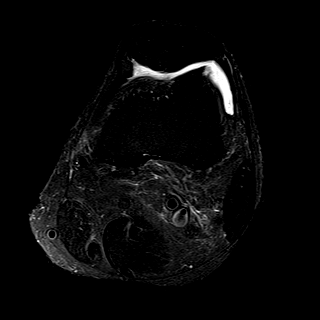
[im 30/30]
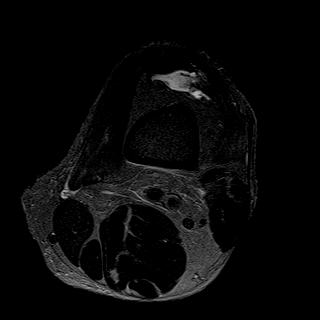

[Series 8: T1 · coronal · 4.0mm · 0.62mm/px · 6 of 32 slices shown]
[im 1/32]
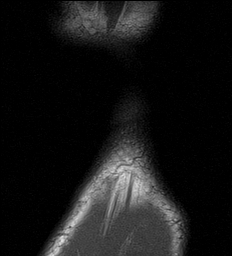
[im 7/32]
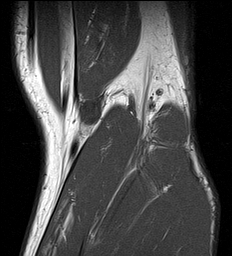
[im 13/32]
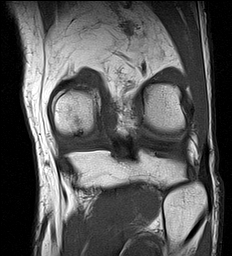
[im 19/32]
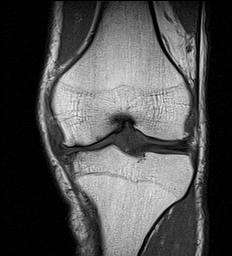
[im 25/32]
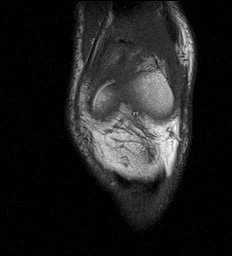
[im 32/32]
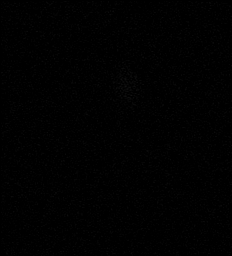

[Series 10: T2 fat-sat · coronal · 4.0mm · 0.62mm/px · 6 of 32 slices shown (2 of 3)]
[im 1/32]
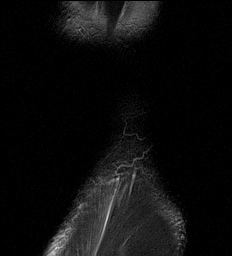
[im 7/32]
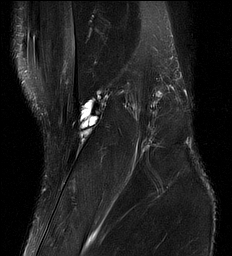
[im 13/32]
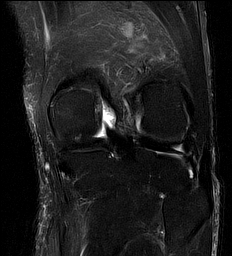
[im 19/32]
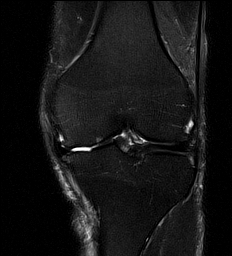
[im 25/32]
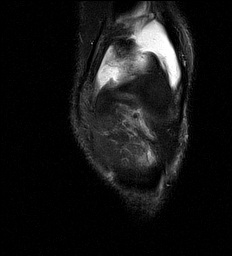
[im 32/32]
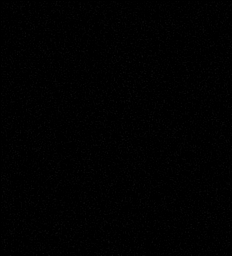

[Series 11: PD fat-sat · coronal · 4.0mm · 0.62mm/px · 6 of 32 slices shown (1 of 3)]
[im 1/32]
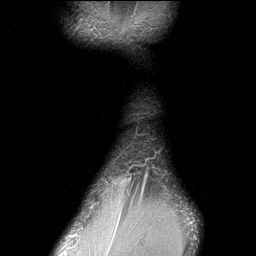
[im 7/32]
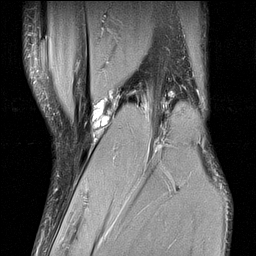
[im 13/32]
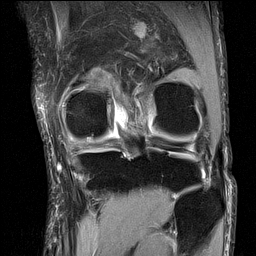
[im 19/32]
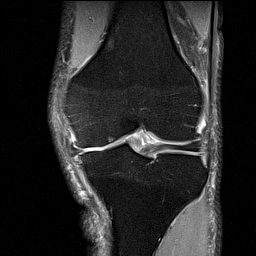
[im 25/32]
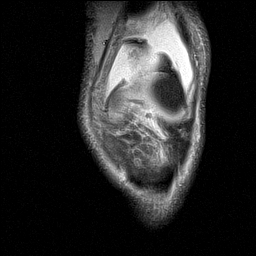
[im 32/32]
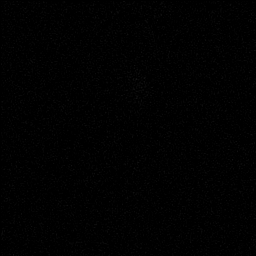

[Series 12: T2 fat-sat · sagittal · 3.0mm · 0.62mm/px · 6 of 28 slices shown (3 of 3)]
[im 1/28]
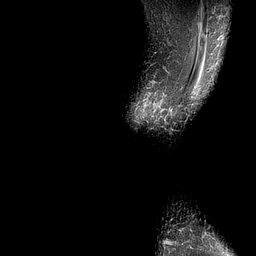
[im 6/28]
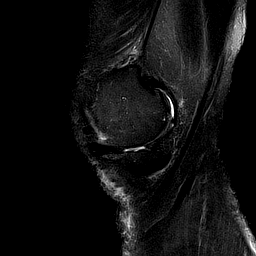
[im 11/28]
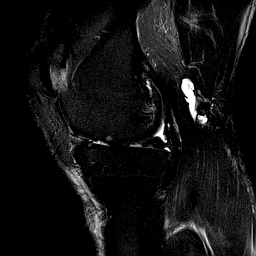
[im 17/28]
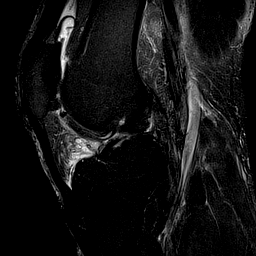
[im 22/28]
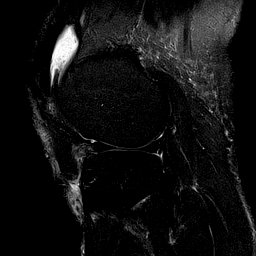
[im 28/28]
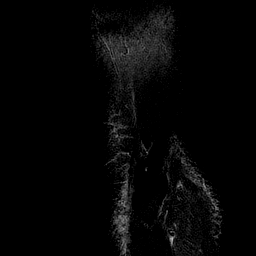

[Series 13: PD fat-sat · sagittal · 3.0mm · 0.62mm/px · 6 of 28 slices shown (2 of 3)]
[im 1/28]
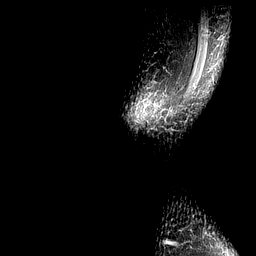
[im 6/28]
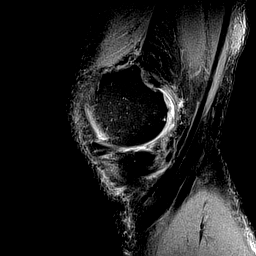
[im 11/28]
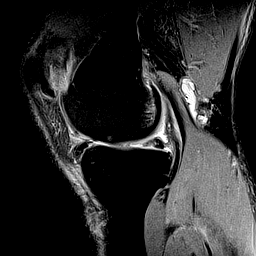
[im 17/28]
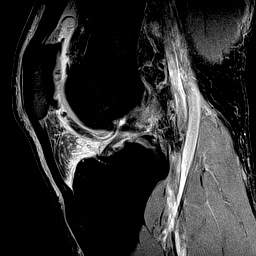
[im 22/28]
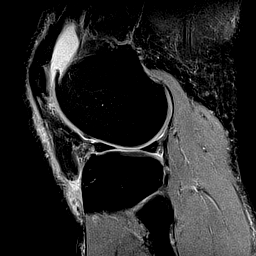
[im 28/28]
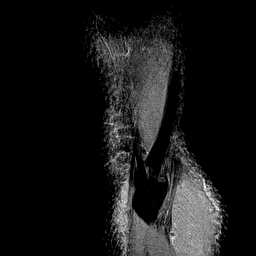

[Series 14: PD fat-sat · oblique · 2.0mm · 0.62mm/px · 4 of 19 slices shown (3 of 3)]
[im 1/19]
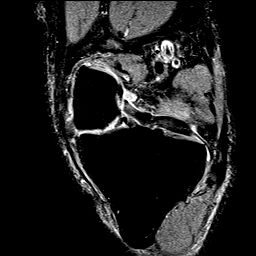
[im 7/19]
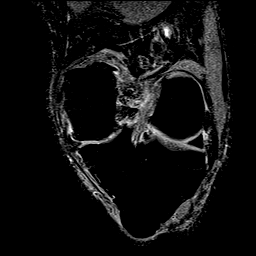
[im 13/19]
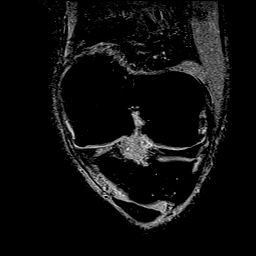
[im 19/19]
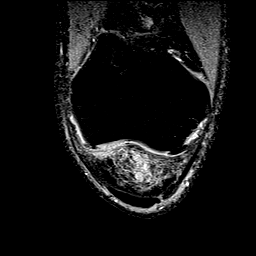

[40 of 40 positions shown; findings below may reference images not displayed]

FINDINGS: MENISCI

Medial: There is a complex horizontal longitudinal tear of the
posterior horn of the medial meniscus which extends to the root
attachment. There is slight extrusion of the mid body. No displaced
meniscal fragment however is noted.

Lateral: Increased intrasubstance signal seen at the posterior horn
lateral meniscus in the anterior horn of the lateral meniscus,
however they did contact the underlying articular surface.

LIGAMENTS

Cruciates: Increased intrasubstance signal seen at the anterior
insertion site of the ACL with tiny probable intrasubstance ganglion
cyst. The PCL is intact.

Collaterals: Increased signal and thickening seen around medial
collateral ligament. The lateral collateral ligamentous complex is
intact.

CARTILAGE

Patellofemoral: Chondral fissuring seen within the central patellar
apex and the medial patellar facet with small subchondral cystic
changes. There is slight lateral subluxation of the patella.

Medial compartment: There is near complete cartilage loss seen
within the weight-bearing surface of the medial medial tibial
plateau small underlying subchondral cystic changes and marginal
osteophytes.

Lateral compartment: Mild chondral thinning seen the weight-bearing
surface of the lateral femoral condyle.

BONES: No fracture. No avascular necrosis. No pathologic marrow
infiltration.

JOINT: A moderate knee joint effusion is noted with scattered
debris. There is edema within the superolateral Hoffa's fat pad.
Within the anterolateral Tatou there is a multilocular cyst
measuring approximately 8 mm in transverse dimension could represent
a ganglion cyst with adjacent edema. There is also infrapatellar
recess fluid noted.

EXTENSOR MECHANISM: The patellar and quadriceps tendon are intact.
The retinaculum is unremarkable.

POPLITEAL FOSSA: A multilocular popliteal cyst is present with
evidence of recent partial rupture. There is edema within the
popliteal fossa.

OTHER: The visualized muscles are normal in appearance. Prepatellar
subcutaneous edema is noted.
IMPRESSION: 1. Complex tear of the posterior medial meniscus extending to the
mid body with slight extrusion.
2. Intrasubstance degeneration of the ACL with small ganglion cyst
at the anterior insertion site, however it is intact.
3. Tricompartmental osteoarthritis, advanced within the medial
compartment
4. Moderate knee joint effusion with synovitis
5. Small ganglion cyst within the Hoffa's fat pad.
6. Infrapatellar recess bursitis
7. Loculated popliteal cyst with evidence of recent partial rupture.

## 2021-10-23 ENCOUNTER — Other Ambulatory Visit: Payer: Self-pay | Admitting: Family Medicine

## 2021-11-01 ENCOUNTER — Encounter: Payer: Self-pay | Admitting: Cardiology

## 2021-11-06 MED ORDER — BRILINTA 90 MG PO TABS: 90.0000 mg | ORAL_TABLET | Freq: Two times a day (BID) | ORAL | 0 refills | Status: DC

## 2021-11-07 ENCOUNTER — Ambulatory Visit: Payer: PPO | Admitting: Family Medicine

## 2021-11-08 NOTE — Telephone Encounter (Signed)
Faxed prescription to [facility]  on 11/07/21. Confirmation received fax went through ?

## 2021-12-25 NOTE — Progress Notes (Signed)
? ?Cardiology Clinic Note  ? ?Patient Name: Ryan [name] ?Date of Encounter: 12/26/2021 ? ?Primary Care Provider:  Everrett [name], [place], DO ?Primary Cardiologist:  Ryan Lemmaavid Harding, MD ? ?Patient Profile  ?  ?Ryan [name] 71 year old male presents to the clinic today for follow-up evaluation of his coronary artery disease and essential hypertension. ? ?Past Medical History  ?  ?Past Medical History:  ?Diagnosis Date  ? Cervical spine arthritis 2007  ? C6-7 hemidiscectomy  ? Coronary artery disease 04/02/2020  ? Cardiac cath on 04/04/2020 for non-STEMI: mLAD ~70% & small-mod D1 60-65%, LCx CTO with L-L & R-L collateral filling OM (unable to cross); Large Dom RCA (ectatic with diffuse mild irregularties) --< RPDA & PRAV-PL mild-mod irregulatities. EF 50-55%, anterolateral-apical HK. EDP 10 mmHg   ? COVID-19 virus infection 09/2019  ? 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 Diagnosed in setting of non-STEMI; A1c 10.4.  ? DJD (degenerative joint disease) of knee   ? Bilateral knees.  ? Genital herpes   ? Has standing dose of Valtrex  ? Hyperlipidemia   ? Hypertension   ? OSA on CPAP 2000  ? Pulmonary nodules 03/2020  ? CTA-PE protocol (04/02/2020): No PE.  Lung nodules measuring up to 3 mm noted.  ? Sleep apnea   ? ?Past Surgical History:  ?Procedure Laterality Date  ? CERVICAL SPINE SURGERY  2007  ? C6-7 hemidiscectomy  ? LEFT HEART CATH AND CORONARY ANGIOGRAPHY  04/04/2020  ? Ryan StarchJohn M. McCabe, MD; [facility]: mLAD ~70% & (small-moderate) D1 60 to 70%; nondominant LCx mid vessel CTO prior to OM (OM fills via L-L and R-L collaterals); large-dominant RCA with mild ectasia and diffuse irregularities.  Bifurcates into RPDA and RPL V-PL with mild to moderate irregularities.  EF 50 to 55%.  Anterolateral-apical HK.  LVEDP 10 mmHg.  ? TRANSTHORACIC ECHOCARDIOGRAM  04/03/2020  ?  [facility] normal LV size and thickness.  EF 55  to 60%.  Possible mid apical lateral HK.  Not well visualized RV.  Otherwise normal valves.  ? ? ?Allergies ? ?No Known Allergies ? ?History of Present Illness  ?  ?Ryan [name] has a PMH of coronary artery disease, NSTEMI (04/02/2020) underwent cardiac catheterization 7/27 which showed three-vessel disease.  Chronic total occlusion to circumflex, 70% LAD, 60% D1.  Options for treatment were discussed with CABG versus PCI.  He opted for medical management.  His PMH also includes HTN, HLD, OSA on CPAP, hyponatremia, herpes simplex virus, and bilateral knee pain. ? ?He was seen by Dr. Herbie [name] 07/29/2020.  During that time he was doing well.  He reported that his energy level had gotten better since stopping his beta-blocker and reducing his ARB.  He did report episodes of a flip-flopping sensation in his chest that was not prolonged.  He denied chest pain and dyspnea on exertion.  He continued to garden and work on his cars.  He mainly enjoyed working on his [employer].  He continued to note brief episodes of gasping sensation.  He denied drinking caffeinated beverages but was taking caffeine tablets.  He reported improvement with his dyspnea. ? ?He presents to the clinic today for follow-up evaluation states he feels that his energy level is not what it used to be.  He has noticed a decrease over the last 10 years.  We reviewed his previous NSTEMI and previously reported coronary lesions.  He expressed understanding.  He continues to  be compliant with Brilinta and is receiving this medication from [facility].  He has not been as physically active over the last 6 to 8 weeks due to bilateral cataract surgery.  He is also limiting his physical activity due to his bilateral knee pain.  He is not working on his cars as much as he did used to.  He does report that he is able to hold a 80 pound trash can to the curb.  This activity makes him short of breath but does not cause chest pain and he is able to recover within  seconds. ?He tolerated cataract surgery as well.  He is planning to use his recumbent bike more and is working on his diet.  We reviewed the option of an echocardiogram.  At this time we will defer echocardiogram and have him increase his physical activity and work on his diet.  He plans to lose around 20 pounds.  I have asked him to monitor his blood pressure and report a log in 2 weeks.  We will refill his Brilinta, give him the mindfulness stress reduction information, have him increase his physical activity as tolerated, and plan follow-up in 6 months. ? ?Today he denies chest pain, shortness of breath, lower extremity edema, fatigue, palpitations, melena, hematuria, hemoptysis, diaphoresis, weakness, presyncope, syncope, orthopnea, and PND. ? ? ?Home Medications  ?  ?Prior to Admission medications   ?Medication Sig Start Date End Date Taking? Authorizing Provider  ?aspirin 81 MG EC tablet Take 81 mg by mouth.     [provider]  ?BRILINTA 90 MG TABS tablet Take 1 tablet (90 mg total) by mouth 2 (two) times daily. 11/06/21   Ryan Lex, MD  ?cetirizine (ZYRTEC) 10 MG tablet Take by mouth.    [provider]  ?glipiZIDE (GLUCOTROL XL) 10 MG 24 hr tablet Take 1 tablet (10 mg total) by mouth daily. 10/23/21   Ryan Coombe, DO  ?isosorbide mononitrate (IMDUR) 30 MG 24 hr tablet Take 30 mg by mouth daily. 05/01/20   [provider]  ?nitroGLYCERIN (NITROSTAT) 0.4 MG SL tablet SMARTSIG:1 Tablet(s) Sublingual PRN 04/06/20   [provider]  ?valACYclovir (VALTREX) 1000 MG tablet Take 1 tablet (1,000 mg total) by mouth daily. 10/03/20   Ryan Coombe, DO  ?valsartan (DIOVAN) 40 MG tablet TAKE ONE TABLET BY MOUTH EVERY DAY 04/28/21   Ryan Lex, MD  ? ? ?Family History  ?  ?Family History  ?Problem Relation Age of Onset  ? Diabetes Father   ? Diabetes Other   ?     Several people in the family have diabetes, but no noted CAD.  ? ?He indicated that the status of his father is  unknown. He indicated that the status of his other is unknown. ? ?Social History  ?  ?Social History  ? ?Socioeconomic History  ? Marital status: Divorced  ?  Spouse name: Not on file  ? Number of children: Not on file  ? Years of education: Not on file  ? Highest education level: Not on file  ?Occupational History  ? Occupation: Retired  ?Tobacco Use  ? Smoking status: Never  ? Smokeless tobacco: Never  ?Vaping Use  ? Vaping Use: Never used  ?Substance and Sexual Activity  ? Alcohol use: Yes  ?  Alcohol/week: 3.0 standard drinks  ?  Types: 3 Standard drinks or equivalent per week  ?  Comment: Occasional social  ? Drug use: Never  ? Sexual activity: Yes  ?  Partners: Female  ?Other Topics Concern  ? Not on file  ?Social History Narrative  ? He is currently completing a long very stressful and somewhat mean-spirited divorce.  Has been under a lot of stress with transition of property etc.  He is just wanting to make a full clean break.  ?   ? Prior to his MI, he was very active walking 30 minutes most days of the week.  ? ?Social Determinants of Health  ? ?Financial Resource Strain: Not on file  ?Food Insecurity: Not on file  ?Transportation Needs: Not on file  ?Physical Activity: Not on file  ?Stress: Not on file  ?Social Connections: Not on file  ?Intimate Partner Violence: Not on file  ?  ? ?Review of Systems  ?  ?General:  No chills, fever, night sweats or weight changes.  ?Cardiovascular:  No chest pain, dyspnea on exertion, edema, orthopnea, palpitations, paroxysmal nocturnal dyspnea. ?Dermatological: No rash, lesions/masses ?Respiratory: No cough, dyspnea ?Urologic: No hematuria, dysuria ?Abdominal:   No nausea, vomiting, diarrhea, bright red blood per rectum, melena, or hematemesis ?Neurologic:  No visual changes, wkns, changes in mental status. ?All other systems reviewed and are otherwise negative except as noted above. ? ?Physical Exam  ?  ?VS:  BP 136/82   Pulse 96   Ht 6\' 1"  (1.854 m)   Wt 246 lb 3.2  oz (111.7 kg)   SpO2 99%   BMI 32.48 kg/m?  , BMI Body mass index is 32.48 kg/m?. ?GEN: Well nourished, well developed, in no acute distress. ?HEENT: normal. ?Neck: Supple, no JVD, carotid bruits, or masses.

## 2021-12-26 ENCOUNTER — Encounter: Payer: Self-pay | Admitting: General Practice

## 2021-12-26 ENCOUNTER — Other Ambulatory Visit: Payer: Self-pay

## 2021-12-26 ENCOUNTER — Telehealth: Payer: Self-pay | Admitting: *Deleted

## 2021-12-26 ENCOUNTER — Ambulatory Visit: Payer: PPO | Admitting: General Practice

## 2021-12-26 VITALS — BP 136/82 | HR 96 | Ht 73.0 in | Wt 246.2 lb

## 2021-12-26 DIAGNOSIS — I10 Essential (primary) hypertension: Secondary | ICD-10-CM

## 2021-12-26 DIAGNOSIS — I25119 Atherosclerotic heart disease of native coronary artery with unspecified angina pectoris: Secondary | ICD-10-CM

## 2021-12-26 DIAGNOSIS — E785 Hyperlipidemia, unspecified: Secondary | ICD-10-CM | POA: Diagnosis not present

## 2021-12-26 DIAGNOSIS — E118 Type 2 diabetes mellitus with unspecified complications: Secondary | ICD-10-CM

## 2021-12-26 MED ORDER — TICAGRELOR 90 MG PO TABS: 90.0000 mg | ORAL_TABLET | Freq: Two times a day (BID) | ORAL | 1 refills | Status: DC

## 2021-12-26 MED ORDER — TICAGRELOR 90 MG PO TABS: 90.0000 mg | ORAL_TABLET | Freq: Two times a day (BID) | ORAL | 2 refills | Status: DC

## 2021-12-26 NOTE — Patient Instructions (Signed)
Medication Instructions:  ?The current medical regimen is effective;  continue present plan and medications as directed. Please refer to the Current Medication list given to you today.  ? ?*If you need a refill on your cardiac medications before your next appointment, please call your pharmacy* ? ?Lab Work:   Testing/Procedures:  ?NONE    NONE ? ?If you have labs (blood work) drawn today and your tests are completely normal, you will receive your results only by: ?MyChart Message (if you have MyChart) OR  A paper copy in the mail ?If you have any lab test that is abnormal or we need to change your treatment, we will call you to review the results. ? ?Special Instructions ?PLEASE READ AND FOLLOW SALTY 6-ATTACHED-1,800mg  daily ? ?PLEASE INCREASE PHYSICAL ACTIVITY AS TOLERATED  ? ?PLEASE READ AND FOLLOW STRESS TIPS-ATTACHED ? ?Follow-Up: ?Your next appointment:  6 month(s) In Person with Glenetta Hew, MD  ? ?Please call our office 2 months in advance to schedule this appointment  :1 ? ?At [facility], you and your health needs are our priority.  As part of our continuing mission to provide you with exceptional heart care, we have created designated Provider Care Teams.  These Care Teams include your primary Cardiologist (physician) and Advanced Practice Providers (APPs -  Physician Assistants and Nurse Practitioners) who all work together to provide you with the care you need, when you need it. ? ?We recommend signing up for the patient portal called "MyChart".  Sign up information is provided on this After Visit Summary.  MyChart is used to connect with patients for Virtual Visits (Telemedicine).  Patients are able to view lab/test results, encounter notes, upcoming appointments, etc.  Non-urgent messages can be sent to your provider as well.   ?To learn more about what you can do with MyChart, go to [email or web address].   ? ? ?Important Information About Sugar ? ? ? ? ? ? ?        6 SALTY THINGS TO AVOID      1,800MG  DAILY ? ? ? ? ? Mindfulness-Based Stress Reduction ?Mindfulness-based stress reduction (MBSR) is a program that helps people learn to practice mindfulness. Mindfulness is the practice of consciously paying attention to the present moment. MBSR focuses on developing self-awareness, which lets you respond to life stress without judgment or negative feelings. It can be learned and practiced through techniques such as education, breathing exercises, meditation, and yoga. MBSR includes several mindfulness techniques in one program. ?MBSR works best when you understand the treatment, are willing to try new things, and can commit to spending time practicing what you learn. MBSR training may include learning about: ?How your feelings, thoughts, and reactions affect your body. ?New ways to respond to things that cause negative thoughts to start (triggers). ?How to notice your thoughts and let go of them. ?Practicing awareness of everyday things that you normally do without thinking. ?The techniques and goals of different types of meditation. ?What are the benefits of MBSR? ?MBSR can have many benefits, which include helping you to: ?Develop self-awareness. This means knowing and understanding yourself. ?Learn skills and attitudes that help you to take part in your own health care. ?Learn new ways to care for yourself. ?Be more accepting about how things are, and let things go. ?Be less judgmental and approach things with an open mind. ?Be patient with yourself and trust yourself more. ?MBSR has also been shown to: ?Reduce negative emotions, such as sadness, overwhelm, and worry. ?Improve memory  and focus. ?Change how you sense and react to pain. ?Boost your body's ability to fight infections. ?Help you connect better with other people. ?Improve your sense of well-being. ?How to practice mindfulness ?To do a basic awareness exercise: ?Find a comfortable place to sit. ?Pay attention to the present moment. Notice your  thoughts, feelings, and surroundings just as they are. ?Avoid judging yourself, your feelings, or your surroundings. Make note of any judgment that comes up and let it go. ?Your mind may wander, and that is okay. Make note of when your thoughts drift, and return your attention to the present moment. ?To do basic mindfulness meditation: ?Find a comfortable place to sit. This may include a stable chair or a firm floor cushion. ?Sit upright with your back straight. Let your arms fall next to your sides, with your hands resting on your legs. ?If you are sitting in a chair, rest your feet flat on the floor. ?If you are sitting on a cushion, cross your legs in front of you. ?Keep your head in a neutral position with your chin dropped slightly. Relax your jaw and rest the tip of your tongue on the roof of your mouth. Drop your gaze to the floor or close your eyes. ?Breathe normally and pay attention to your breath. Feel the air moving in and out of your nose. Feel your belly expanding and relaxing with each breath. ?Your mind may wander, and that is okay. Make note of when your thoughts drift, and return your attention to your breath. ?Avoid judging yourself, your feelings, or your surroundings. Make note of any judgment or feelings that come up, let them go, and bring your attention back to your breath. ?When you are ready, lift your gaze or open your eyes. Pay attention to how your body feels after the meditation. ?Follow these instructions at home: ? ?Find a local in-person or online MBSR program. ?Set aside some time regularly for mindfulness practice. Practice every day if you can. Even 10 minutes of practice is helpful. ?Find a mindfulness practice that works best for you. This may include one or more of the following: ?Meditation. This involves focusing your mind on a certain thought or activity. ?Breathing awareness exercises. These help you to stay present by focusing on your breath. ?Body scan. For this practice,  you lie down and pay attention to each part of your body from head to toe. You can identify tension and soreness and consciously relax parts of your body. ?Yoga. Yoga involves stretching and breathing, and it can improve your ability to move and be flexible. It can also help you to test your body's limits, which can help you release stress. ?Mindful eating. This way of eating involves focusing on the taste, texture, color, and smell of each bite of food. This slows down eating and helps you feel full sooner. For this reason, it can be an important part of a weight loss plan. ?Find a podcast or recording that provides guidance for breathing awareness, body scan, or meditation exercises. You can listen to these any time when you have a free moment to rest without distractions. ?Follow your treatment plan as told by your health care provider. This may include taking regular medicines and making changes to your diet or lifestyle as recommended. ?Where to find more information ?You can find more information about MBSR from: ?Your health care provider. ?Community-based meditation centers or programs. ?Programs offered near you. ?Summary ?Mindfulness-based stress reduction (MBSR) is a program that teaches  you how to consciously pay attention to the present moment. It is used to help you deal better with daily stress, feelings, and pain. ?MBSR focuses on developing self-awareness, which allows you to respond to life stress without judgment or negative feelings. ?MBSR programs may involve learning different mindfulness practices, such as breathing exercises, meditation, yoga, body scan, or mindful eating. Find a mindfulness practice that works best for you, and set aside time for it on a regular basis. ?This information is not intended to replace advice given to you by your health care provider. Make sure you discuss any questions you have with your health care provider. ?Document Revised: 04/06/2021 Document Reviewed:  04/06/2021 ?Elsevier Patient Education ? [place]. ? ?

## 2021-12-26 NOTE — Telephone Encounter (Signed)
Open error 

## 2021-12-26 NOTE — Addendum Note (Signed)
Addended by: Alyson Ingles on: 12/26/2021 03:32 PM ? ? Modules accepted: Orders ? ?

## 2022-02-01 ENCOUNTER — Other Ambulatory Visit: Payer: Self-pay | Admitting: Cardiology

## 2022-03-07 ENCOUNTER — Ambulatory Visit: Payer: PPO | Admitting: Family Medicine

## 2022-04-04 ENCOUNTER — Ambulatory Visit: Payer: PPO | Admitting: Family Medicine

## 2022-04-04 ENCOUNTER — Encounter: Payer: Self-pay | Admitting: Family Medicine

## 2022-04-04 VITALS — BP 163/94 | HR 87 | Ht 73.0 in | Wt 246.2 lb

## 2022-04-04 DIAGNOSIS — Z9989 Dependence on other enabling machines and devices: Secondary | ICD-10-CM | POA: Diagnosis not present

## 2022-04-04 DIAGNOSIS — G4733 Obstructive sleep apnea (adult) (pediatric): Secondary | ICD-10-CM | POA: Diagnosis not present

## 2022-04-04 NOTE — Progress Notes (Signed)
PATIENT: Ryan [name] DOB: Nov 28, 1950  REASON FOR VISIT: follow up HISTORY FROM: patient  Chief Complaint  Patient presents with   Obstructive Sleep Apnea    Rm 16, alone. Here for yearly CPAP f/u. Pt reports doing well on CPAP. DME:[facility]     HISTORY OF PRESENT ILLNESS:  04/04/22 ALL:  Ryan [name] returns for follow up for OSA on CPAP. He continues to do well. He is using CPAP nightly for about 7 hours. He reports his old nasal mask was discontinued and he is having a harder time adjusting to the new mask and head gear. He feels he is doing better, now. He orders supplies online. He has used CPAP over 22 years. Current machine is 70 year old.     03/07/2021 ALL:  Ryan [name] is a 71 y.o. male here today for follow up for OSA on CPAP. He was diagnosed with OSA in 2020 and has used CPAP since. He recently received a new CPAP machine. HST confirmed mild OSA with AHI of 17.3/hr. he is doing very well with his new machine. He did have some difficulty with DME communication. He did not receive a mask when CPAP was set up. He has had a difficult time but has recently received a new mask and headgear. He is currently using a nasal mask.       REVIEW OF SYSTEMS: Out of a complete 14 system review of symptoms, the patient complains only of the following symptoms, none and all other reviewed systems are negative.  ESS: 5/24, previously 3/24   ALLERGIES: No Known Allergies  HOME MEDICATIONS: Outpatient Medications Prior to Visit  Medication Sig Dispense Refill   aspirin 81 MG EC tablet Take 81 mg by mouth.      cetirizine (ZYRTEC) 10 MG tablet Take by mouth.     glipiZIDE (GLUCOTROL XL) 10 MG 24 hr tablet Take 1 tablet (10 mg total) by mouth daily. 90 tablet 1   nitroGLYCERIN (NITROSTAT) 0.4 MG SL tablet SMARTSIG:1 Tablet(s) Sublingual PRN     ticagrelor (BRILINTA) 90 MG TABS tablet Take 1 tablet (90 mg total) by mouth 2 (two) times daily. Reference [id number] 180 tablet 2    valACYclovir (VALTREX) 1000 MG tablet Take 1 tablet (1,000 mg total) by mouth daily. 90 tablet 3   valsartan (DIOVAN) 40 MG tablet TAKE ONE TABLET BY MOUTH EVERY DAY 90 tablet 3   No facility-administered medications prior to visit.    PAST MEDICAL HISTORY: Past Medical History:  Diagnosis Date   Cervical spine arthritis 2007   C6-7 hemidiscectomy   Coronary artery disease 04/02/2020   Cardiac cath on 04/04/2020 for non-STEMI: mLAD ~70% & small-mod D1 60-65%, LCx CTO with L-L & R-L collateral filling OM (unable to cross); Large Dom RCA (ectatic with diffuse mild irregularties) --< RPDA & PRAV-PL mild-mod irregulatities. EF 50-55%, anterolateral-apical HK. 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   DJD (degenerative joint disease) of knee    Bilateral knees.   Genital herpes    Has standing dose of Valtrex   Hyperlipidemia    Hypertension    OSA on CPAP 2000   Pulmonary nodules 03/2020   CTA-PE protocol (04/02/2020): No PE.  Lung nodules measuring up to 3 mm noted.   Sleep apnea     PAST SURGICAL HISTORY: Past Surgical History:  Procedure Laterality  Date   CERVICAL SPINE SURGERY  2007   C6-7 hemidiscectomy   LEFT HEART CATH AND CORONARY ANGIOGRAPHY  04/04/2020   Ryan Starch, MD; [facility]: mLAD ~70% & (small-moderate) D1 60 to 70%; nondominant LCx mid vessel CTO prior to OM (OM fills via L-L and R-L collaterals); large-dominant RCA with mild ectasia and diffuse irregularities.  Bifurcates into RPDA and RPL V-PL with mild to moderate irregularities.  EF 50 to 55%.  Anterolateral-apical HK.  LVEDP 10 mmHg.   TRANSTHORACIC ECHOCARDIOGRAM  04/03/2020    [facility]) normal LV size and thickness.  EF 55 to 60%.  Possible mid apical lateral HK.  Not well visualized RV.  Otherwise normal  valves.    FAMILY HISTORY: Family History  Problem Relation Age of Onset   Diabetes Father    Diabetes Other        Several people in the family have diabetes, but no noted CAD.    SOCIAL HISTORY: Social History   Socioeconomic History   Marital status: Divorced    Spouse name: Not on file   Number of children: Not on file   Years of education: Not on file   Highest education level: Not on file  Occupational History   Occupation: Retired  Tobacco Use   Smoking status: Never   Smokeless tobacco: Never  Vaping Use   Vaping Use: Never used  Substance and Sexual Activity   Alcohol use: Yes    Alcohol/week: 3.0 standard drinks of alcohol    Types: 3 Standard drinks or equivalent per week    Comment: Occasional social   Drug use: Never   Sexual activity: Yes    Partners: Female  Other Topics Concern   Not on file  Social History Narrative   He is currently completing a long very stressful and somewhat mean-spirited divorce.  Has been under a lot of stress with transition of property etc.  He is just wanting to make a full clean break.      Prior to his MI, he was very active walking 30 minutes most days of the week.   Social Determinants of Health   Financial Resource Strain: Not on file  Food Insecurity: Not on file  Transportation Needs: Not on file  Physical Activity: Not on file  Stress: Not on file  Social Connections: Not on file  Intimate Partner Violence: Not on file     PHYSICAL EXAM  Vitals:   04/04/22 1308  BP: (!) 163/94  Pulse: 87  Weight: 246 lb 3.2 oz (111.7 kg)  Height: 6\' 1"  (1.854 m)    Body mass index is 32.48 kg/m.  Generalized: Well developed, in no acute distress  Cardiology: normal rate and rhythm, no murmur noted Respiratory: clear to auscultation bilaterally  Neurological examination  Mentation: Alert oriented to time, place, history taking. Follows all commands speech and language fluent Cranial nerve II-XII: Pupils were  equal round reactive to light. Extraocular movements were full, visual field were full  Motor: The motor testing reveals 5 over 5 strength of all 4 extremities. Good symmetric motor tone is noted throughout.  Gait and station: Gait is normal.    DIAGNOSTIC DATA (LABS, IMAGING, TESTING) - I reviewed patient records, labs, notes, testing and imaging myself where available.      No data to display           Lab Results  Component Value Date   WBC 8.9 05/09/2021  HGB 15.6 05/09/2021   HCT 44.8 05/09/2021   MCV 97.8 05/09/2021   PLT 257 05/09/2021      Component Value Date/Time   NA 138 05/09/2021 0000   NA 138 07/14/2020 0858   K 4.6 05/09/2021 0000   CL 101 05/09/2021 0000   CO2 27 05/09/2021 0000   GLUCOSE 179 (H) 05/09/2021 0000   BUN 15 05/09/2021 0000   BUN 17 07/14/2020 0858   CREATININE 1.03 05/09/2021 0000   CALCIUM 9.6 05/09/2021 0000   PROT 7.5 05/09/2021 0000   PROT 7.4 07/14/2020 0858   ALBUMIN 4.6 07/14/2020 0858   AST 26 05/09/2021 0000   ALT 35 05/09/2021 0000   ALKPHOS 59 07/14/2020 0858   BILITOT 0.5 05/09/2021 0000   BILITOT 0.7 07/14/2020 0858   GFRNONAA 56 (L) 07/14/2020 0858   GFRAA 65 07/14/2020 0858   Lab Results  Component Value Date   CHOL 229 (H) 05/09/2021   HDL 32 (L) 05/09/2021   LDLCALC  05/09/2021     Comment:     . LDL cholesterol not calculated. Triglyceride levels greater than 400 mg/dL invalidate calculated LDL results. . Reference range: <100 . Desirable range <100 mg/dL for primary prevention;   <70 mg/dL for patients with CHD or diabetic patients  with > or = 2 CHD risk factors. Marland Kitchen LDL-C is now calculated using the Martin-Hopkins  calculation, which is a validated novel method providing  better accuracy than the Friedewald equation in the  estimation of LDL-C.  Horald Pollen et al. Lenox Ahr. 0962;836(62): 2061-2068  (http://education.[employer].com/faq/FAQ164)    LDLDIRECT 96 05/09/2021   TRIG 888 (H) 05/09/2021    CHOLHDL 7.2 (H) 05/09/2021   Lab Results  Component Value Date   HGBA1C 7.2 (H) 05/09/2021   No results found for: "VITAMINB12" Lab Results  Component Value Date   TSH 2.910 07/14/2020     ASSESSMENT AND PLAN 71 y.o. year old male  has a past medical history of Cervical spine arthritis (2007), Coronary artery disease (04/02/2020), COVID-19 virus infection (09/2019), Diabetes mellitus type 2 with complications, uncontrolled (03/2020), DJD (degenerative joint disease) of knee, Genital herpes, Hyperlipidemia, Hypertension, OSA on CPAP (2000), Pulmonary nodules (03/2020), and Sleep apnea. here with     ICD-10-CM   1. OSA on CPAP  G47.33    Z99.89         Fatih D Baillie is doing well on CPAP therapy. Compliance report reveals excellent compliance. He was encouraged to continue using CPAP nightly and for greater than 4 hours each night. We will update supply orders as indicated. Risks of untreated sleep apnea review and education materials provided. Healthy lifestyle habits encouraged. He will follow up in 1 year, sooner if needed. He verbalizes understanding and agreement with this plan.    No orders of the defined types were placed in this encounter.     No orders of the defined types were placed in this encounter.      Shawnie Dapper, FNP-C 04/04/2022, 1:16 PM [facility] [address], [address] [place], [place] [address] [phone]

## 2022-04-04 NOTE — Patient Instructions (Signed)

## 2022-04-04 NOTE — Progress Notes (Signed)
CM sent to [facility] for new order ?

## 2022-05-02 ENCOUNTER — Encounter: Payer: Self-pay | Admitting: General Practice

## 2022-05-03 ENCOUNTER — Other Ambulatory Visit: Payer: Self-pay | Admitting: Family Medicine

## 2022-05-17 ENCOUNTER — Telehealth: Payer: Self-pay | Admitting: Sports Medicine

## 2022-05-17 ENCOUNTER — Telehealth: Payer: Self-pay

## 2022-05-17 ENCOUNTER — Ambulatory Visit (INDEPENDENT_AMBULATORY_CARE_PROVIDER_SITE_OTHER): Payer: PPO | Admitting: Family Medicine

## 2022-05-17 ENCOUNTER — Encounter: Payer: Self-pay | Admitting: Family Medicine

## 2022-05-17 VITALS — BP 146/83 | HR 84 | Ht 73.0 in | Wt 247.0 lb

## 2022-05-17 DIAGNOSIS — I25119 Atherosclerotic heart disease of native coronary artery with unspecified angina pectoris: Secondary | ICD-10-CM

## 2022-05-17 DIAGNOSIS — E785 Hyperlipidemia, unspecified: Secondary | ICD-10-CM | POA: Diagnosis not present

## 2022-05-17 DIAGNOSIS — I10 Essential (primary) hypertension: Secondary | ICD-10-CM

## 2022-05-17 DIAGNOSIS — E118 Type 2 diabetes mellitus with unspecified complications: Secondary | ICD-10-CM

## 2022-05-17 DIAGNOSIS — A609 Anogenital herpesviral infection, unspecified: Secondary | ICD-10-CM

## 2022-05-17 DIAGNOSIS — M25561 Pain in right knee: Secondary | ICD-10-CM

## 2022-05-17 DIAGNOSIS — G8929 Other chronic pain: Secondary | ICD-10-CM

## 2022-05-17 DIAGNOSIS — M25562 Pain in left knee: Secondary | ICD-10-CM

## 2022-05-17 LAB — POCT GLYCOSYLATED HEMOGLOBIN (HGB A1C): HbA1c, POC (controlled diabetic range): 8.7 % — AB (ref 0.0–7.0)

## 2022-05-17 MED ORDER — GLIPIZIDE ER 10 MG PO TB24: 10.0000 mg | ORAL_TABLET | Freq: Every day | ORAL | 1 refills | Status: DC

## 2022-05-17 MED ORDER — VALACYCLOVIR HCL 1 G PO TABS: 1000.0000 mg | ORAL_TABLET | Freq: Every day | ORAL | 3 refills | Status: AC

## 2022-05-17 NOTE — Assessment & Plan Note (Signed)
Recommend follow up with Dr. Benjamin Stain.

## 2022-05-17 NOTE — Telephone Encounter (Signed)
Patient saw the surgeon at [facility] orthopedics, they are requesting that we get him approved for viscosupplementation, I have not seen him in a year and a half so he will need to be scheduled for an appointment so we can get a note in the chart before starting the approval process.

## 2022-05-17 NOTE — Progress Notes (Signed)
Ryan [name] - 71 y.o. male MRN [id number]  Date of birth: 1951/07/21  Subjective Chief Complaint  Patient presents with   Diabetes    HPI Ryan [name] is a 71 y.o. here today for follow up visit.   Cataract surgery since last visit.    History of CAD, s/p MI in 2021.  Seen by cardiology 12/2021. Reviewed previous note.  No anginal symptoms at that time.  Recommended continuation of DAPT and weight loss for risk factor reduction.    Diabetes has been managed with glipizide.  He is eating a lot at night.  Blood sugars at home have been elevated.   He has seen a dietician in the past interested in return visit for follow up on this.   BP is elevated today. He brings a log of blood pressures from home which have been well controlled.    ROS:  A comprehensive ROS was completed and negative except as noted per HPI   No Known Allergies  Past Medical History:  Diagnosis Date   Cervical spine arthritis 2007   C6-7 hemidiscectomy   Coronary artery disease 04/02/2020   Cardiac cath on 04/04/2020 for non-STEMI: mLAD ~70% & small-mod D1 60-65%, LCx CTO with L-L & R-L collateral filling OM (unable to cross); Large Dom RCA (ectatic with diffuse mild irregularties) --< RPDA & PRAV-PL mild-mod irregulatities. EF 50-55%, anterolateral-apical HK. 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   DJD (degenerative joint disease) of knee    Bilateral knees.   Genital herpes    Has standing dose of Valtrex   Hyperlipidemia    Hypertension    OSA on CPAP 2000   Pulmonary nodules 03/2020   CTA-PE protocol (04/02/2020): No PE.  Lung nodules measuring up to 3 mm noted.   Sleep apnea     Past Surgical History:  Procedure Laterality Date   CERVICAL SPINE SURGERY  2007   C6-7 hemidiscectomy   LEFT HEART CATH AND CORONARY ANGIOGRAPHY  04/04/2020   Lucrezia Starch, MD; [facility]: mLAD ~70% & (small-moderate) D1 60 to 70%; nondominant LCx mid vessel CTO prior to OM (OM fills via L-L and R-L collaterals); large-dominant RCA with mild ectasia and diffuse irregularities.  Bifurcates into RPDA and RPL V-PL with mild to moderate irregularities.  EF 50 to 55%.  Anterolateral-apical HK.  LVEDP 10 mmHg.   TRANSTHORACIC ECHOCARDIOGRAM  04/03/2020    [facility]) normal LV size and thickness.  EF 55 to 60%.  Possible mid apical lateral HK.  Not well visualized RV.  Otherwise normal valves.    Social History   Socioeconomic History   Marital status: Divorced    Spouse name: Not on file   Number of children: Not on file   Years of education: Not on file   Highest education level: Not on file  Occupational History   Occupation: Retired  Tobacco Use   Smoking status: Never   Smokeless tobacco: Never  Vaping Use   Vaping Use: Never used  Substance and Sexual Activity   Alcohol use: Yes    Alcohol/week: 3.0 standard drinks of alcohol    Types: 3 Standard drinks or equivalent per week    Comment: Occasional social   Drug use: Never   Sexual activity: Yes    Partners: Female  Other Topics Concern  Not on file  Social History Narrative   He is currently completing a long very stressful and somewhat mean-spirited divorce.  Has been under a lot of stress with transition of property etc.  He is just wanting to make a full clean break.      Prior to his MI, he was very active walking 30 minutes most days of the week.   Social Determinants of Health   Financial Resource Strain: Not on file  Food Insecurity: Not on file  Transportation Needs: Not on file  Physical Activity: Not on file  Stress: Not on file  Social Connections: Not on file    Family History  Problem Relation Age of Onset   Diabetes Father    Diabetes Other        Several people in the family have diabetes, but no noted CAD.     Health Maintenance  Topic Date Due   Diabetic kidney evaluation - Urine ACR  Never done   Pneumonia Vaccine 104+ Years old (1 - PCV) Never done   HEMOGLOBIN A1C  11/07/2021   Diabetic kidney evaluation - GFR measurement  05/09/2022   OPHTHALMOLOGY EXAM  05/09/2022   COVID-19 Vaccine (3 - Pfizer risk series) 10/11/2022 (Originally 02/15/2020)   Zoster Vaccines- Shingrix (2 of 2) 10/11/2022 (Originally 11/05/2013)   INFLUENZA VACCINE  12/09/2022 (Originally 04/10/2022)   TETANUS/TDAP  05/18/2023 (Originally 04/08/1971)   Hepatitis C Screening  05/18/2023 (Originally 04/07/1970)   FOOT EXAM  05/18/2023   Fecal DNA (Cologuard)  05/12/2024   HPV VACCINES  Aged Out     ----------------------------------------------------------------------------------------------------------------------------------------------------------------------------------------------------------------- Physical Exam BP (!) 185/82 (BP Location: Left Arm, Patient Position: Sitting, Cuff Size: Large)   Pulse 84   Ht 6\' 1"  (1.854 m)   Wt 247 lb (112 kg)   SpO2 96%   BMI 32.59 kg/m   Physical Exam Constitutional:      Appearance: Normal appearance.  Eyes:     General: No scleral icterus. Cardiovascular:     Rate and Rhythm: Normal rate and regular rhythm.  Pulmonary:     Effort: Pulmonary effort is normal.     Breath sounds: Normal breath sounds.  Musculoskeletal:     Cervical back: Neck supple.  Neurological:     General: No focal deficit present.     Mental Status: He is alert.  Psychiatric:        Mood and Affect: Mood normal.        Behavior: Behavior normal.     ------------------------------------------------------------------------------------------------------------------------------------------------------------------------------------------------------------------- Assessment and Plan  Type 2 diabetes mellitus with complication, without long-term current use of insulin (HCC) A1c has increased  since last visit.  Recommend addition of Ozempic, however he declines at this time.  Discussed cardiac benefits of GLP-1 as well, still would like to hold off on this. Referral placed to dietician for continued MNT  Coronary artery disease involving native coronary artery of native heart with angina pectoris (HCC) Followed by cardiology.  No new anginal symptoms at this time.   Hyperlipidemia with target LDL less than 70 Update lipid panel.   HSV (herpes simplex virus) anogenital infection Valtrex renewed.   Bilateral knee pain Recommend follow up with Dr. .    No orders of the defined types were placed in this encounter.   No follow-ups on file.    This visit occurred during the SARS-CoV-2 public health emergency.  Safety protocols were in place, including screening questions prior to the visit, additional usage of staff PPE, and extensive  cleaning of exam room while observing appropriate contact time as indicated for disinfecting solutions.

## 2022-05-17 NOTE — Assessment & Plan Note (Signed)
A1c has increased since last visit.  Recommend addition of Ozempic, however he declines at this time.  Discussed cardiac benefits of GLP-1 as well, still would like to hold off on this. Referral placed to dietician for continued MNT

## 2022-05-17 NOTE — Telephone Encounter (Signed)
Please contact this patient to schedule an appointment with Dr. Karie Schwalbe concerning bilateral knee OrthoVisc. Thank you

## 2022-05-17 NOTE — Patient Instructions (Addendum)
Work on dietary changes.  See Dr. Benjamin Stain and try to work on activity increase.  Continue to check BP at home.  Return in 2 weeks for BP check-Nurse visit.  Bring your home BP cuff to compare.

## 2022-05-17 NOTE — Assessment & Plan Note (Signed)
Update lipid panel.  

## 2022-05-17 NOTE — Assessment & Plan Note (Signed)
Followed by cardiology.  No new anginal symptoms at this time.

## 2022-05-17 NOTE — Assessment & Plan Note (Signed)
Valtrex renewed.  

## 2022-05-17 NOTE — Telephone Encounter (Signed)
Called patient and left voicemail regarding scheduling.

## 2022-05-18 ENCOUNTER — Encounter: Payer: Self-pay | Admitting: Family Medicine

## 2022-05-18 LAB — MICROALBUMIN / CREATININE URINE RATIO
Creatinine, Urine: 68 mg/dL (ref 20–320)
Microalb Creat Ratio: 94 mcg/mg creat — ABNORMAL HIGH (ref <30)
Microalb, Ur: 6.4 mg/dL

## 2022-05-18 LAB — COMPLETE METABOLIC PANEL WITH GFR
AG Ratio: 1.4 (calc) (ref 1.0–2.5)
ALT: 41 U/L (ref 9–46)
AST: 30 U/L (ref 10–35)
Albumin: 4.3 g/dL (ref 3.6–5.1)
Alkaline phosphatase (APISO): 43 U/L (ref 35–144)
BUN: 15 mg/dL (ref 7–25)
CO2: 30 mmol/L (ref 20–32)
Calcium: 9.2 mg/dL (ref 8.6–10.3)
Chloride: 98 mmol/L (ref 98–110)
Creat: 1.1 mg/dL (ref 0.70–1.28)
Globulin: 3.1 g/dL (calc) (ref 1.9–3.7)
Glucose, Bld: 242 mg/dL — ABNORMAL HIGH (ref 65–99)
Potassium: 4.2 mmol/L (ref 3.5–5.3)
Sodium: 135 mmol/L (ref 135–146)
Total Bilirubin: 0.6 mg/dL (ref 0.2–1.2)
Total Protein: 7.4 g/dL (ref 6.1–8.1)
eGFR: 72 mL/min/{1.73_m2} (ref >=60)

## 2022-05-18 LAB — CBC WITH DIFFERENTIAL/PLATELET
Absolute Monocytes: 640 cells/uL (ref 200–950)
Basophils Absolute: 73 cells/uL (ref 0–200)
Basophils Relative: 1.1 %
Eosinophils Absolute: 396 cells/uL (ref 15–500)
Eosinophils Relative: 6 %
HCT: 48.3 % (ref 38.5–50.0)
Hemoglobin: 16.7 g/dL (ref 13.2–17.1)
Lymphs Abs: 2402 cells/uL (ref 850–3900)
MCH: 34 pg — ABNORMAL HIGH (ref 27.0–33.0)
MCHC: 34.6 g/dL (ref 32.0–36.0)
MCV: 98.4 fL (ref 80.0–100.0)
MPV: 10.9 fL (ref 7.5–12.5)
Monocytes Relative: 9.7 %
Neutro Abs: 3089 cells/uL (ref 1500–7800)
Neutrophils Relative %: 46.8 %
Platelets: 205 10*3/uL (ref 140–400)
RBC: 4.91 10*6/uL (ref 4.20–5.80)
RDW: 12.7 % (ref 11.0–15.0)
Total Lymphocyte: 36.4 %
WBC: 6.6 10*3/uL (ref 3.8–10.8)

## 2022-05-18 LAB — DIRECT LDL: Direct LDL: 78 mg/dL (ref <100)

## 2022-05-18 LAB — LIPID PANEL W/REFLEX DIRECT LDL
Cholesterol: 230 mg/dL — ABNORMAL HIGH (ref <200)
HDL: 31 mg/dL — ABNORMAL LOW (ref >=40)
Non-HDL Cholesterol (Calc): 199 mg/dL (calc) — ABNORMAL HIGH (ref <130)
Total CHOL/HDL Ratio: 7.4 (calc) — ABNORMAL HIGH (ref <5.0)
Triglycerides: 769 mg/dL — ABNORMAL HIGH (ref <150)

## 2022-05-21 MED ORDER — ICOSAPENT ETHYL 1 G PO CAPS: 2.0000 g | ORAL_CAPSULE | Freq: Two times a day (BID) | ORAL | 1 refills | Status: DC

## 2022-05-21 NOTE — Telephone Encounter (Signed)
Completed.

## 2022-05-25 MED ORDER — ICOSAPENT ETHYL 1 G PO CAPS: 2.0000 g | ORAL_CAPSULE | Freq: Two times a day (BID) | ORAL | 1 refills | Status: DC

## 2022-05-25 NOTE — Addendum Note (Signed)
Addended by: Chalmers Cater on: 05/25/2022 07:11 AM   Modules accepted: Orders

## 2022-05-28 MED ORDER — OMEGA-3-ACID ETHYL ESTERS 1 G PO CAPS: 2.0000 g | ORAL_CAPSULE | Freq: Two times a day (BID) | ORAL | 1 refills | Status: AC

## 2022-05-28 NOTE — Telephone Encounter (Signed)
Changing to generic lovaza.

## 2022-05-28 NOTE — Addendum Note (Signed)
Addended by: Perlie Mayo on: 05/28/2022 05:25 PM   Modules accepted: Orders

## 2022-05-31 ENCOUNTER — Ambulatory Visit (INDEPENDENT_AMBULATORY_CARE_PROVIDER_SITE_OTHER): Payer: PPO | Admitting: Family Medicine

## 2022-05-31 VITALS — BP 152/69 | HR 84

## 2022-05-31 DIAGNOSIS — F320 Major depressive disorder, single episode, mild: Secondary | ICD-10-CM | POA: Diagnosis not present

## 2022-05-31 DIAGNOSIS — I10 Essential (primary) hypertension: Secondary | ICD-10-CM | POA: Diagnosis not present

## 2022-05-31 NOTE — Progress Notes (Signed)
Medical screening examination/treatment was performed by qualified clinical staff member and as supervising physician I was immediately available for consultation/collaboration. I have reviewed documentation and agree with assessment and plan.  Blood pressure elevated in clinic on recheck today.  Reports that readings at home have remained well controlled.  Does not feel he needs to increase his valsartan at this time based on his home readings.  His home cuff is fairly accurate when compared to our readings in the office today.  Recommend that he continue to monitor this at home.    Feels like he may be dealing with some depression and anxiety as well.  Would like referral to therapist.  I spent 20 minutes with patient reviewing blood pressure, medications and discussing above.  Luetta Nutting, DO

## 2022-05-31 NOTE — Progress Notes (Signed)
   Established Patient Office Visit  Subjective   Patient ID: Ryan [name], male    DOB: 12-21-1950  Age: 71 y.o. MRN: [id number]  Chief Complaint  Patient presents with   Hypertension    HPI Patient in office for hypertension follow up .  Patient states home  readings are always better than when in office. Patient also wanting to verify acccuracy of his  home wrist cuff. Patient also states has been trying for several weeks to get Brilinta filled with patient assistance without success.   ROS    Objective:     BP (!) 152/69 (BP Location: Left Arm, Patient Position: Sitting, Cuff Size: Large) Comment: patient's wrist cuff = 156/83  Pulse 84   SpO2 96%    Physical Exam   No results found for any visits on 05/31/22.    The ASCVD Risk score (Arnett DK, et al., 2019) failed to calculate for the following reasons:   The patient has a prior MI or stroke diagnosis    Assessment & Plan:  Hypertension-  patient  spoke with Dr. Zigmund Daniel regarding results. Spoke with [place] and [place]- patient's  Brilinta medication is being  expedited to him . Patient informed to check tracking for this medication to arrive.  Problem List Items Addressed This Visit   None   Return in about 2 weeks (around 06/14/2022) for hypertension.    Rae Lips, LPN

## 2022-06-11 ENCOUNTER — Encounter: Payer: PPO | Attending: Family Medicine | Admitting: Dietician

## 2022-06-11 ENCOUNTER — Encounter: Payer: Self-pay | Admitting: Dietician

## 2022-06-11 DIAGNOSIS — I252 Old myocardial infarction: Secondary | ICD-10-CM | POA: Diagnosis not present

## 2022-06-11 DIAGNOSIS — E785 Hyperlipidemia, unspecified: Secondary | ICD-10-CM | POA: Diagnosis not present

## 2022-06-11 DIAGNOSIS — E119 Type 2 diabetes mellitus without complications: Secondary | ICD-10-CM | POA: Diagnosis not present

## 2022-06-11 DIAGNOSIS — I10 Essential (primary) hypertension: Secondary | ICD-10-CM | POA: Insufficient documentation

## 2022-06-11 DIAGNOSIS — Z713 Dietary counseling and surveillance: Secondary | ICD-10-CM | POA: Insufficient documentation

## 2022-06-11 DIAGNOSIS — E118 Type 2 diabetes mellitus with unspecified complications: Secondary | ICD-10-CM

## 2022-06-11 NOTE — Patient Instructions (Addendum)
Aim for 150 minutes of physical activity weekly. -Bike- start with 5-10 minutes and work up.  -Split it up in morning and evening  Consider trying low-fat dairy (2% milk)  Choose 93/7 beef.  Eat more Non-Starchy Vegetables Aim to make 1/2 of your plate vegetables 2 times per day.   Minimize added sugars and refined grains Rethink what you drink.  Choose beverages without added sugar.  Look for 0 carbs on the label. . Choose whole foods over processed.  Make simple meals at home more often than eating out.  Rinse your canned veggies.

## 2022-06-11 NOTE — Progress Notes (Signed)
Diabetes Self-Management Education  Visit Type: First/Initial  Appt. Start Time: 1400 Appt. End Time: 0867  06/11/2022  Mr. Ryan [name], identified by name and date of birth, is a 71 y.o. male with a diagnosis of Diabetes: Type 2.   ASSESSMENT  Primary concern: Pt states he came to core classes a few years ago but feels that he slipped out of his habits and his numbers have gone up and he wants to get back on track.  History includes:  arthritis, type 2 diabetes, sleep apnea, HLD, HTN, heart attack.  Labs noted:  A1C 8.7% 05/17/22 Medications include: glipizide Supplements: omega 3's  Patient lives with self. Pt does shopping and cooking but mostly grabs easy things from the grocery store.  Pt states he has family hx of diabetes and has seen both controlled and uncontrolled diabetes which inspires him to keep his under control.   Pt is seeing a psychologist for recent feelings of depression over the last few years following heart attack and other life issues.   Height 6\' 1"  (1.854 m), weight 245 lb (111.1 kg). Body mass index is 32.32 kg/m.   Diabetes Self-Management Education - 06/11/22 1406       Visit Information   Visit Type First/Initial      Initial Visit   Diabetes Type Type 2    Date Diagnosed 2021    Are you currently following a meal plan? No    Are you taking your medications as prescribed? Yes      Health Coping   How would you rate your overall health? Fair      Psychosocial Assessment   Patient Belief/Attitude about Diabetes Motivated to manage diabetes    What is the hardest part about your diabetes right now, causing you the most concern, or is the most worrisome to you about your diabetes?   Making healty food and beverage choices    Self-care barriers None    Self-management support Doctor's office    Other persons present Patient    Patient Concerns Nutrition/Meal planning    Special Needs None    Preferred Learning Style No preference  indicated    Learning Readiness Ready    How often do you need to have someone help you when you read instructions, pamphlets, or other written materials from your doctor or pharmacy? 1 - Never    What is the last grade level you completed in school? some college      Pre-Education Assessment   Patient understands the diabetes disease and treatment process. Needs Instruction    Patient understands incorporating nutritional management into lifestyle. Needs Instruction    Patient undertands incorporating physical activity into lifestyle. Needs Instruction    Patient understands using medications safely. Needs Instruction    Patient understands monitoring blood glucose, interpreting and using results Needs Instruction    Patient understands prevention, detection, and treatment of acute complications. Needs Instruction    Patient understands prevention, detection, and treatment of chronic complications. Needs Instruction    Patient understands how to develop strategies to address psychosocial issues. Needs Instruction    Patient understands how to develop strategies to promote health/change behavior. Needs Instruction      Complications   Last HgB A1C per patient/outside source 8.7 %    How often do you check your blood sugar? 3-4 times / week    Postprandial Blood glucose range (mg/dL) 180-200    Have you had a dilated eye exam in the past 12  months? Yes    Have you had a dental exam in the past 12 months? No    Are you checking your feet? No      Dietary Intake   Breakfast grain berry cereal with whole milk    Snack (morning) none    Lunch salad with [place] burger    Snack (afternoon) none    Dinner 2 ham sandwiches on whole wheat bread (2 slices = 18g carb)    Snack (evening) SF jello, mixed nuts    Beverage(s) 8 16oz water bottles, coffee with tsp of honey,      Activity / Exercise   Activity / Exercise Type Light (walking / raking leaves)      Patient Education   Previous  Diabetes Education No    Disease Pathophysiology Definition of diabetes, type 1 and 2, and the diagnosis of diabetes;Factors that contribute to the development of diabetes;Explored patient's options for treatment of their diabetes    Healthy Eating Role of diet in the treatment of diabetes and the relationship between the three main macronutrients and blood glucose level;Food label reading, portion sizes and measuring food.;Reviewed blood glucose goals for pre and post meals and how to evaluate the patients' food intake on their blood glucose level.;Meal timing in regards to the patients' current diabetes medication.;Information on hints to eating out and maintain blood glucose control.    Being Active Role of exercise on diabetes management, blood pressure control and cardiac health.;Helped patient identify appropriate exercises in relation to his/her diabetes, diabetes complications and other health issue.    Medications Reviewed patients medication for diabetes, action, purpose, timing of dose and side effects.;Reviewed medication adjustment guidelines for hyperglycemia and sick days.    Monitoring Identified appropriate SMBG and/or A1C goals.;Daily foot exams;Yearly dilated eye exam    Acute complications Taught prevention, symptoms, and  treatment of hypoglycemia - the 15 rule.;Discussed and identified patients' prevention, symptoms, and treatment of hyperglycemia.    Chronic complications Relationship between chronic complications and blood glucose control;Assessed and discussed foot care and prevention of foot problems;Lipid levels, blood glucose control and heart disease;Identified and discussed with patient  current chronic complications;Reviewed with patient heart disease, higher risk of, and prevention    Diabetes Stress and Support Identified and addressed patients feelings and concerns about diabetes;Role of stress on diabetes;Worked with patient to identify barriers to care and solutions     Lifestyle and Health Coping Lifestyle issues that need to be addressed for better diabetes care      Individualized Goals (developed by patient)   Nutrition General guidelines for healthy choices and portions discussed    Physical Activity 30 minutes per day    Medications take my medication as prescribed    Monitoring  Test my blood glucose as discussed    Problem Solving Eating Pattern    Reducing Risk examine blood glucose patterns;do foot checks daily;treat hypoglycemia with 15 grams of carbs if blood glucose less than 70mg /dL    Health Coping Ask for help with psychological, social, or emotional issues      Post-Education Assessment   Patient understands the diabetes disease and treatment process. Comprehends key points    Patient understands incorporating nutritional management into lifestyle. Comprehends key points    Patient undertands incorporating physical activity into lifestyle. Comprehends key points    Patient understands using medications safely. Demonstrates understanding / competency    Patient understands monitoring blood glucose, interpreting and using results Demonstrates understanding / competency  Patient understands prevention, detection, and treatment of acute complications. Comprehends key points    Patient understands prevention, detection, and treatment of chronic complications. Comprehends key points    Patient understands how to develop strategies to address psychosocial issues. Demonstrates understanding / competency    Patient understands how to develop strategies to promote health/change behavior. Comprehends key points      Outcomes   Expected Outcomes Demonstrated interest in learning. Expect positive outcomes    Future DMSE 3-4 months    Program Status Not Completed             Individualized Plan for Diabetes Self-Management Training:   Learning Objective:  Patient will have a greater understanding of diabetes self-management. Patient  education plan is to attend individual and/or group sessions per assessed needs and concerns.   Plan:   Patient Instructions  Aim for 150 minutes of physical activity weekly. -Bike- start with 5-10 minutes and work up.  -Split it up in morning and evening  Consider trying low-fat dairy (2% milk)  Choose 93/7 beef.  Eat more Non-Starchy Vegetables Aim to make 1/2 of your plate vegetables 2 times per day.   Minimize added sugars and refined grains Rethink what you drink.  Choose beverages without added sugar.  Look for 0 carbs on the label. . Choose whole foods over processed.  Make simple meals at home more often than eating out.  Rinse your canned veggies.          Expected Outcomes:  Demonstrated interest in learning. Expect positive outcomes  Education material provided: ADA - How to Thrive: A Guide for Your Journey with Diabetes, A1C conversion sheet, Meal plan card, and My Plate  If problems or questions, patient to contact team via:  Phone  Future DSME appointment: 3-4 months

## 2022-07-05 ENCOUNTER — Ambulatory Visit (INDEPENDENT_AMBULATORY_CARE_PROVIDER_SITE_OTHER): Payer: PPO | Admitting: Psychologist

## 2022-07-05 DIAGNOSIS — F320 Major depressive disorder, single episode, mild: Secondary | ICD-10-CM

## 2022-07-05 NOTE — Progress Notes (Signed)
Ryan [name] Counselor Initial Adult Exam  Name: Ryan [name] Date: 07/05/2022 MRN: [id number] DOB: [birth year] PCP: Ryan Nutting, DO  Time spent: 10:05 am to 10:45 am; total time: 40 minutes  This session was held via in person. The patient consented to in-person therapy and was in the clinician's office. Limits of confidentiality were discussed with the patient.   Guardian/Payee:  NA    Paperwork requested: No   Reason for Visit /Presenting Problem: Depression  Mental Status Exam: Appearance:   Well Groomed     Behavior:  Appropriate  Motor:  Normal  Speech/Language:   Clear and Coherent  Affect:  Appropriate  Mood:  normal  Thought process:  normal  Thought content:    WNL  Sensory/Perceptual disturbances:    WNL  Orientation:  oriented to person, place, and time/date  Attention:  Good  Concentration:  Good  Memory:  WNL  Fund of knowledge:   Good  Insight:    Fair  Judgment:   Good  Impulse Control:  Good    Reported Symptoms:  The patient endorsed experiencing the following: feeling down, sad, rumination of thoughts, fatigue, lack of motivation, and thoughts of hopelessness. He denied suicidal and homicidal ideation.   Risk Assessment: Danger to Self:  No Self-injurious Behavior: No Danger to Others: No Duty to Warn:no Physical Aggression / Violence:No  Access to Firearms a concern: No  Gang Involvement:No  Patient / guardian was educated about steps to take if suicide or homicide risk level increases between visits: n/a While future psychiatric events cannot be accurately predicted, the patient does not currently require acute inpatient psychiatric care and does not currently meet [facility] involuntary commitment criteria.  Substance Abuse History: Current substance abuse: No     Past Psychiatric History:   No previous psychological problems have been observed Outpatient Providers:NA History of Psych Hospitalization: No   Psychological Testing:  NA    Abuse History:  Victim of: No.,  NA    Report needed: No. Victim of Neglect:No. Perpetrator of  NA   Witness / Exposure to Domestic Violence: No   Protective Services Involvement: No  Witness to [employer] Violence:  No   Family History:  Family History  Problem Relation Age of Onset   Diabetes Father    Diabetes Other        Several people in the family have diabetes, but no noted CAD.    Living situation: the patient lives alone  Sexual Orientation: Straight  Relationship Status: divorced  Name of spouse / other:NA. Patient is dating someone named Ryan [name]. Patient married twice previously If a parent, number of children / ages:NA  Support Systems: Friends  Museum/gallery curator Stress:  No   Income/Employment/Disability: Actor: No   Educational History: Education: some college  Religion/Sprituality/World View: Christian  Any cultural differences that may affect / interfere with treatment:  not applicable   Recreation/Hobbies: Working on cars  Stressors: Health problems   Other: Loss of purpose    Strengths: Supportive Relationships  Barriers:  NA   Legal History: Pending legal issue / charges: The patient has no significant history of legal issues. History of legal issue / charges:  NA  Medical History/Surgical History: reviewed Past Medical History:  Diagnosis Date   Cervical spine arthritis 2007   C6-7 hemidiscectomy   Coronary artery disease 04/02/2020   Cardiac cath on 04/04/2020 for non-STEMI: mLAD ~70% & small-mod D1 60-65%, LCx CTO with L-L & R-L collateral  filling OM (unable to cross); Large Dom RCA (ectatic with diffuse mild irregularties) --< RPDA & PRAV-PL mild-mod irregulatities. EF 50-55%, anterolateral-apical HK. 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   DJD  (degenerative joint disease) of knee    Bilateral knees.   Genital herpes    Has standing dose of Valtrex   Hyperlipidemia    Hypertension    OSA on CPAP 2000   Pulmonary nodules 03/2020   CTA-PE protocol (04/02/2020): No PE.  Lung nodules measuring up to 3 mm noted.   Sleep apnea     Past Surgical History:  Procedure Laterality Date   CERVICAL SPINE SURGERY  2007   C6-7 hemidiscectomy   LEFT HEART CATH AND CORONARY ANGIOGRAPHY  04/04/2020   Lucrezia Starch, MD; [facility]: mLAD ~70% & (small-moderate) D1 60 to 70%; nondominant LCx mid vessel CTO prior to OM (OM fills via L-L and R-L collaterals); large-dominant RCA with mild ectasia and diffuse irregularities.  Bifurcates into RPDA and RPL V-PL with mild to moderate irregularities.  EF 50 to 55%.  Anterolateral-apical HK.  LVEDP 10 mmHg.   TRANSTHORACIC ECHOCARDIOGRAM  04/03/2020    [facility]) normal LV size and thickness.  EF 55 to 60%.  Possible mid apical lateral HK.  Not well visualized RV.  Otherwise normal valves.    Medications: Current Outpatient Medications  Medication Sig Dispense Refill   aspirin 81 MG EC tablet Take 81 mg by mouth.      cetirizine (ZYRTEC) 10 MG tablet Take by mouth.     glipiZIDE (GLUCOTROL XL) 10 MG 24 hr tablet Take 1 tablet (10 mg total) by mouth daily. 90 tablet 1   nitroGLYCERIN (NITROSTAT) 0.4 MG SL tablet SMARTSIG:1 Tablet(s) Sublingual PRN     omega-3 acid ethyl esters (LOVAZA) 1 g capsule Take 2 capsules (2 g total) by mouth 2 (two) times daily. 360 capsule 1   ticagrelor (BRILINTA) 90 MG TABS tablet Take 1 tablet (90 mg total) by mouth 2 (two) times daily. Reference [id number] 180 tablet 2   valACYclovir (VALTREX) 1000 MG tablet Take 1 tablet (1,000 mg total) by mouth daily. 90 tablet 3   valsartan (DIOVAN) 40 MG tablet TAKE ONE TABLET BY MOUTH EVERY DAY 90 tablet 3   No current facility-administered medications for  this visit.    No Known Allergies  Diagnoses:  F32.0 major depressive affective disorder, single episode, mild  Plan of Care: The patient is a 72 year old Caucasian male who was referred due to experiencing depression. The patient lives alone. The patient meets criteria for a diagnosis of F32.0 major depressive affective disorder, single episode, mild based off of the following: feeling down, sad, rumination of thoughts, fatigue, lack of motivation, and thoughts of hopelessness. He denied suicidal and homicidal ideation.   The patient stated that he wants coping skills, to find purpose, to socialize with others, be in a relationship with someone, and work through challenges with  health.   This psychologist makes the recommendation that the patient participate in therapy once a month to assist in meeting his needs.   Hilbert Corrigan, PsyD

## 2022-07-05 NOTE — Plan of Care (Signed)

## 2022-07-05 NOTE — Progress Notes (Signed)
                Ryan Aschenbrenner, PsyD 

## 2022-07-06 ENCOUNTER — Telehealth: Payer: Self-pay | Admitting: Family Medicine

## 2022-07-06 NOTE — Telephone Encounter (Signed)
Called patient and LVM on 07/06/22 regarding [facility] visit -MAJ

## 2022-07-07 ENCOUNTER — Encounter: Payer: Self-pay | Admitting: Cardiology

## 2022-07-10 NOTE — Telephone Encounter (Signed)
Spoke to patient . Appointment schedule for 07/20/22 at 2:45 pm  Patient verbalized understanding

## 2022-07-15 NOTE — Progress Notes (Signed)
Cardiology Clinic Note   Patient Name: Ryan [name] Date of Encounter: 07/20/2022  Primary Care Provider:  Luetta Nutting, DO Primary Cardiologist:  Glenetta Hew, MD  Patient Profile    Ryan [name] 71 year old male presents to the clinic today for follow-up evaluation of his coronary artery disease and essential hypertension.  Past Medical History    Past Medical History:  Diagnosis Date   Cervical spine arthritis 2007   C6-7 hemidiscectomy   Coronary artery disease 04/02/2020   Cardiac cath on 04/04/2020 for non-STEMI: mLAD ~70% & small-mod D1 60-65%, LCx CTO with L-L & R-L collateral filling OM (unable to cross); Large Dom RCA (ectatic with diffuse mild irregularties) --< RPDA & PRAV-PL mild-mod irregulatities. EF 50-55%, anterolateral-apical HK. 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   DJD (degenerative joint disease) of knee    Bilateral knees.   Genital herpes    Has standing dose of Valtrex   Hyperlipidemia    Hypertension    OSA on CPAP 2000   Pulmonary nodules 03/2020   CTA-PE protocol (04/02/2020): No PE.  Lung nodules measuring up to 3 mm noted.   Sleep apnea    Past Surgical History:  Procedure Laterality Date   CERVICAL SPINE SURGERY  2007   C6-7 hemidiscectomy   LEFT HEART CATH AND CORONARY ANGIOGRAPHY  04/04/2020   Horald Chestnut, MD; [facility]: mLAD ~70% & (small-moderate) D1 60 to 70%; nondominant LCx mid vessel CTO prior to OM (OM fills via L-L and R-L collaterals); large-dominant RCA with mild ectasia and diffuse irregularities.  Bifurcates into RPDA and RPL V-PL with mild to moderate irregularities.  EF 50 to 55%.  Anterolateral-apical HK.  LVEDP 10 mmHg.   TRANSTHORACIC ECHOCARDIOGRAM  04/03/2020    ([facility]) normal LV size and thickness.  EF 55 to  60%.  Possible mid apical lateral HK.  Not well visualized RV.  Otherwise normal valves.    Allergies  No Known Allergies  History of Present Illness    Ryan [name] has a PMH of coronary artery disease, NSTEMI (04/02/2020) underwent cardiac catheterization 7/27 which showed three-vessel disease.  Chronic total occlusion to circumflex, 70% LAD, 60% D1.  Options for treatment were discussed with CABG versus PCI.  He opted for medical management.  His PMH also includes HTN, HLD, OSA on CPAP, hyponatremia, herpes simplex virus, and bilateral knee pain.  He was seen by Dr. Ellyn Hack 07/29/2020.  During that time he was doing well.  He reported that his energy level had gotten better since stopping his beta-blocker and reducing his ARB.  He did report episodes of a flip-flopping sensation in his chest that was not prolonged.  He denied chest pain and dyspnea on exertion.  He continued to garden and work on his cars.  He mainly enjoyed working on his [employer].  He continued to note brief episodes of gasping sensation.  He denied drinking caffeinated beverages but was taking caffeine tablets.  He reported improvement with his dyspnea.  He presented to the clinic 12/26/2021 for follow-up evaluation stated he felt that his energy level was not what it used to be.  He had noticed a decrease over the last 10 years.  We reviewed his previous NSTEMI and previously reported coronary lesions.  He expressed understanding.  He continued to be  compliant with Brilinta and was receiving this medication from [facility].  He had not been as physically active over the last 6 to 8 weeks due to bilateral cataract surgery.  He was also limiting his physical activity due to his bilateral knee pain.  He was not working on his cars as much as he did used to.  He did report that he was able to pull a 80 pound trash can to the curb.  The activity made him short of breath but did not cause chest pain and he was able to recover within  seconds.  He tolerated cataract surgery as well.  He was planning to use his recumbent bike more and was working on his diet.  We reviewed the option of an echocardiogram.  At the time we used shared decision make to decide to defer echocardiogram and have him increase his physical activity and work on his diet.  He planned to lose around 20 pounds.  I asked him to monitor his blood pressure and report a log in 2 weeks.  I refilled his Brilinta, gave him the mindfulness stress reduction information, had him increase his physical activity as tolerated, and planned follow-up in 6 months.  He presents to the clinic today for follow-up evaluation states he is planning to transition to a new PCP that specializes in Senior care in [facility].  He does not feel that he has a good connection with his current PCP.  We reviewed his previous angiography.  He continues to take 90 mg of Brilinta as well as aspirin.  He inquires about having to continue to take 90 mg Brilinta daily as well as aspirin.  He has been increasing his physical activity and reports that he is using his recumbent bike most days of the week.  He is also starting to work on his cars again.  He notes some depression after his divorce.  He has been seeing a nutritionist which has helped him with his diet.  His blood pressure initially in the clinic today is 168/88 and on recheck is 134/80.  He notes that his blood pressure is usually elevated in the clinic setting.  His blood pressure is well controlled at home.  He monitors regularly.  I will reach out to Dr. Ellyn Hack regarding his dual antiplatelet therapy and whether his Brilinta can be reduced or discontinued.  We will have him continue his improve diet and increase physical activity.  I will also give him a month of stress reduction sheet.  We will plan follow-up in 9 to 6 months.  Today he denies chest pain, shortness of breath, lower extremity edema, fatigue, palpitations, melena, hematuria,  hemoptysis, diaphoresis, weakness, presyncope, syncope, orthopnea, and PND.     Home Medications    Prior to Admission medications   Medication Sig Start Date End Date Taking? Authorizing Provider  aspirin 81 MG EC tablet Take 81 mg by mouth.     [provider]  BRILINTA 90 MG TABS tablet Take 1 tablet (90 mg total) by mouth 2 (two) times daily. 11/06/21   Leonie Man, MD  cetirizine (ZYRTEC) 10 MG tablet Take by mouth.    [provider]  glipiZIDE (GLUCOTROL XL) 10 MG 24 hr tablet Take 1 tablet (10 mg total) by mouth daily. 10/23/21   Luetta Nutting, DO  isosorbide mononitrate (IMDUR) 30 MG 24 hr tablet Take 30 mg by mouth daily. 05/01/20   [provider]  nitroGLYCERIN (NITROSTAT) 0.4 MG SL tablet  SMARTSIG:1 Tablet(s) Sublingual PRN 04/06/20   [provider]  valACYclovir (VALTREX) 1000 MG tablet Take 1 tablet (1,000 mg total) by mouth daily. 10/03/20   Luetta Nutting, DO  valsartan (DIOVAN) 40 MG tablet TAKE ONE TABLET BY MOUTH EVERY DAY 04/28/21   Leonie Man, MD    Family History    Family History  Problem Relation Age of Onset   Diabetes Father    Diabetes Other        Several people in the family have diabetes, but no noted CAD.   He indicated that the status of his father is unknown. He indicated that the status of his other is unknown.  Social History    Social History   Socioeconomic History   Marital status: Divorced    Spouse name: Not on file   Number of children: Not on file   Years of education: Not on file   Highest education level: Not on file  Occupational History   Occupation: Retired  Tobacco Use   Smoking status: Never   Smokeless tobacco: Never  Vaping Use   Vaping Use: Never used  Substance and Sexual Activity   Alcohol use: Yes    Alcohol/week: 3.0 standard drinks of alcohol    Types: 3 Standard drinks or equivalent per week    Comment: Occasional social   Drug use: Never   Sexual activity: Yes     Partners: Female  Other Topics Concern   Not on file  Social History Narrative   He is currently completing a long very stressful and somewhat mean-spirited divorce.  Has been under a lot of stress with transition of property etc.  He is just wanting to make a full clean break.      Prior to his MI, he was very active walking 30 minutes most days of the week.   Social Determinants of Health   Financial Resource Strain: Not on file  Food Insecurity: Not on file  Transportation Needs: Not on file  Physical Activity: Not on file  Stress: Not on file  Social Connections: Not on file  Intimate Partner Violence: Not on file     Review of Systems    General:  No chills, fever, night sweats or weight changes.  Cardiovascular:  No chest pain, dyspnea on exertion, edema, orthopnea, palpitations, paroxysmal nocturnal dyspnea. Dermatological: No rash, lesions/masses Respiratory: No cough, dyspnea Urologic: No hematuria, dysuria Abdominal:   No nausea, vomiting, diarrhea, bright red blood per rectum, melena, or hematemesis Neurologic:  No visual changes, wkns, changes in mental status. All other systems reviewed and are otherwise negative except as noted above.  Physical Exam    VS:  BP 134/80   Pulse 93   Ht 6\' 1"  (1.854 m)   Wt 245 lb 6.4 oz (111.3 kg)   SpO2 97%   BMI 32.38 kg/m  , BMI Body mass index is 32.38 kg/m. GEN: Well nourished, well developed, in no acute distress. HEENT: normal. Neck: Supple, no JVD, carotid bruits, or masses. Cardiac: RRR, no murmurs, rubs, or gallops. No clubbing, cyanosis, edema.  Radials/DP/PT 2+ and equal bilaterally.  Respiratory:  Respirations regular and unlabored, clear to auscultation bilaterally. GI: Soft, nontender, nondistended, BS + x 4. MS: no deformity or atrophy. Skin: warm and dry, no rash. Neuro:  Strength and sensation are intact. Psych: Normal affect.  Accessory Clinical Findings    Recent Labs: 05/17/2022: ALT 41; BUN 15;  Creat 1.10; Hemoglobin 16.7; Platelets 205; Potassium 4.2; Sodium  135   Recent Lipid Panel    Component Value Date/Time   CHOL 230 (H) 05/17/2022 0000   CHOL 122 07/14/2020 0858   TRIG 769 (H) 05/17/2022 0000   HDL 31 (L) 05/17/2022 0000   HDL 31 (L) 07/14/2020 0858   CHOLHDL 7.4 (H) 05/17/2022 0000   LDLCALC  05/17/2022 0000     Comment:     . LDL cholesterol not calculated. Triglyceride levels greater than 400 mg/dL invalidate calculated LDL results. . Reference range: <100 . Desirable range <100 mg/dL for primary prevention;   <70 mg/dL for patients with CHD or diabetic patients  with > or = 2 CHD risk factors. Marland Kitchen LDL-C is now calculated using the Martin-Hopkins  calculation, which is a validated novel method providing  better accuracy than the Friedewald equation in the  estimation of LDL-C.  Cresenciano Genre et al. Annamaria Helling. WG:[id number]): 2061-2068  (http://education.[employer].com/faq/FAQ164)    LDLDIRECT 78 05/17/2022 0000    ECG personally reviewed by me today-none today  EKG normal sinus rhythm inferior infarct undetermined age 38 bpm- No acute changes  EKG 05/05/2020  Sinus bradycardia 59 bpm no acute changes.  Assessment & Plan   1. Essential hypertension-BP today 134/80.  Well-controlled at home.  Report noticing increased pressure with each doctor/clinic visit. Continue  valsartan Heart healthy low-sodium diet-salty 6 reviewed Increase physical activity as tolerated Continue to monitor at home.  Coronary artery disease-no recent chest discomfort.  Cardiac catheterization which showed three-vessel CAD.  CTO of circumflex.  It was felt that his LAD and diagonal lesions were not overly concerning and consistent with severe disease.  He opted for medical management.    Continues compliance with dual antiplatelet therapy.  NSTEMI once at [facility] cardiology 7/21.  Patient inquires about continuing dual antiplatelet therapy.  I will reach out to  Dr. Ellyn Hack about continuing 90 mg Brilinta and aspirin. Continue aspirin, Imdur, nitroglycerin, valsartan Heart healthy low-sodium diet-salty 6 given Increase physical activity as tolerated  Hyperlipidemia- 05/17/2022: Cholesterol 230; HDL 31; Triglycerides 769. Compliant with Lovaza. Continue rosuvastatin,  Heart healthy low-sodium high-fiber diet Increase physical activity as tolerated  Type 2 diabetes-glucose 242 on 05/17/2022 Continue glipizide, Jardiance Heart healthy low-sodium calorie restricted carb modified diet.   Increase physical activity as tolerated Follows with PCP  Disposition: Follow-up with Dr. Ellyn Hack in 6-9 months.   Jossie Ng. Ameliah Baskins NP-C    07/20/2022, 4:41 PM [facility] [place] [address] Office [phone] Fax [phone]  Notice: This dictation was prepared with Dragon dictation along with smaller phrase technology. Any transcriptional errors that result from this process are unintentional and may not be corrected upon review.  I spent 14 minutes examining this patient, reviewing medications, and using patient centered shared decision making involving her cardiac care.  Prior to her visit I spent greater than 20 minutes reviewing her past medical history,  medications, and prior cardiac tests.

## 2022-07-20 ENCOUNTER — Encounter: Payer: Self-pay | Admitting: General Practice

## 2022-07-20 ENCOUNTER — Ambulatory Visit: Payer: PPO | Attending: General Practice | Admitting: General Practice

## 2022-07-20 VITALS — BP 134/80 | HR 93 | Ht 73.0 in | Wt 245.4 lb

## 2022-07-20 DIAGNOSIS — I25119 Atherosclerotic heart disease of native coronary artery with unspecified angina pectoris: Secondary | ICD-10-CM

## 2022-07-20 DIAGNOSIS — E118 Type 2 diabetes mellitus with unspecified complications: Secondary | ICD-10-CM | POA: Diagnosis not present

## 2022-07-20 DIAGNOSIS — I10 Essential (primary) hypertension: Secondary | ICD-10-CM | POA: Diagnosis not present

## 2022-07-20 DIAGNOSIS — E785 Hyperlipidemia, unspecified: Secondary | ICD-10-CM | POA: Diagnosis not present

## 2022-07-20 NOTE — Patient Instructions (Addendum)
Medication Instructions:  Your physician recommends that you continue on your current medications as directed. Please refer to the Current Medication list given to you today. *If you need a refill on your cardiac medications before your next appointment, please call your pharmacy*   Lab Work: None Ordered   Testing/Procedures: None ordered   Follow-Up: At [facility], you and your health needs are our priority.  As part of our continuing mission to provide you with exceptional heart care, we have created designated Provider Care Teams.  These Care Teams include your primary Cardiologist (physician) and Advanced Practice Providers (APPs -  Physician Assistants and Nurse Practitioners) who all work together to provide you with the care you need, when you need it.  We recommend signing up for the patient portal called "MyChart".  Sign up information is provided on this After Visit Summary.  MyChart is used to connect with patients for Virtual Visits (Telemedicine).  Patients are able to view lab/test results, encounter notes, upcoming appointments, etc.  Non-urgent messages can be sent to your provider as well.   To learn more about what you can do with MyChart, go to [email or web address].    Your next appointment:   9-12 month(s)  The format for your next appointment:   In Person  Provider:   Bryan Lemma, MD     Other Instructions   Important Information About Sugar

## 2022-07-24 ENCOUNTER — Telehealth: Payer: Self-pay

## 2022-07-24 NOTE — Telephone Encounter (Signed)
-----   Message from Ronney Asters, NP sent at 07/22/2022  5:03 PM EST ----- Please contact Mr. Willet and let him know that his medications have been reviewed by myself and Dr. Herbie Baltimore.  We will discontinue his Brilinta and aspirin.  We will start him on Plavix 75 mg daily.  Thank you for your help.  Thomasene Ripple. Cleaver NP-C    07/22/2022, 5:04 PM [name] [address] Office [phone] Fax [phone]  ----- Message ----- From: Marykay Lex, MD Sent: 07/21/2022   2:57 PM EST To: Ronney Asters, NP  Never had stents -- (I haven't seen him in 2 yrs).  Sure, with no PCI & 3 V CAD - would probably opt for Plavix monotherapy.    DH ----- Message ----- From: Ronney Asters, NP Sent: 07/20/2022   4:46 PM EST To: Marykay Lex, MD  Patient with NSTEMI 7/21.  He was noted to have three-vessel CAD with CTO of circumflex.  Do you want to continue Brilinta 90 mg twice daily along with aspirin?  Patient inquires about whether he can reduce Brilinta or discontinue aspirin.  Thanks for your help.

## 2022-07-24 NOTE — Telephone Encounter (Signed)
Left message to call back. Changed medication list.

## 2022-07-26 MED ORDER — CLOPIDOGREL BISULFATE 75 MG PO TABS: 75.0000 mg | ORAL_TABLET | Freq: Every day | ORAL | 6 refills | Status: DC

## 2022-07-26 NOTE — Addendum Note (Signed)
Addended by: Alyson Ingles on: 07/26/2022 08:28 AM   Modules accepted: Orders

## 2022-07-26 NOTE — Telephone Encounter (Signed)
Pt notified Rx sent to requested pharmacy 

## 2022-08-09 ENCOUNTER — Ambulatory Visit (INDEPENDENT_AMBULATORY_CARE_PROVIDER_SITE_OTHER): Payer: PPO | Admitting: Psychologist

## 2022-08-09 DIAGNOSIS — F320 Major depressive disorder, single episode, mild: Secondary | ICD-10-CM

## 2022-08-09 NOTE — Progress Notes (Signed)
                Nahome Bublitz, PsyD 

## 2022-08-09 NOTE — Progress Notes (Signed)
[facility] Counselor/Therapist Progress Note  Patient ID: Ryan [name], MRN: [id number],    Date: 08/09/2022  Time Spent: 10:05 am to 10:50 am; total time: 45 minutes   This session was held via in person. The patient consented to in-person therapy and was in the clinician's office. Limits of confidentiality were discussed with the patient.    Treatment Type: Individual Therapy  Reported Symptoms: Depression  Mental Status Exam: Appearance:  Well Groomed     Behavior: Appropriate  Motor: Normal  Speech/Language:  Clear and Coherent  Affect: Appropriate  Mood: normal  Thought process: normal  Thought content:   WNL  Sensory/Perceptual disturbances:   WNL  Orientation: oriented to person, place, and time/date  Attention: Good  Concentration: Good  Memory: WNL  Fund of knowledge:  Good  Insight:   Fair  Judgment:  Good  Impulse Control: Good   Risk Assessment: Danger to Self:  No Self-injurious Behavior: No Danger to Others: No Duty to Warn:no Physical Aggression / Violence:No  Access to Firearms a concern: No  Gang Involvement:No   Subjective: Beginning the session, patient described himself as okay indicating that not much has changed since the intake. After reviewing the treatment plan, patient spent session reflecting on wanting to be in a relationship. He began by reflecting on his currently relationship with Ryan [name] and how they will have a conversation after Christmas regarding their relationship. He then spent time reflecting on the idea of looking at alternative means to find someone to be in a relationship with long term. He was agreeable to homework and following up. He denied suicidal and homicidal ideation.    Interventions:  Worked on developing a therapeutic relationship with the patient using active listening and reflective statements. Provided emotional support using empathy and validation. Reviewed the treatment plan with the patient.  Reviewed events since the intake. Validated thoughts. Identified goals for the session. Explored the theme of wanting to be in a relationship. Used socratic questions to assist the patient gain insight into self. Processed current relationship with Ryan [name]. Challenged some of the thoughts expressed. Briefly explored patient's values. Explored the idea of looking at alternative means to being in a relationship. Assisted in problem solving. Processed thoughts and emotions. Normalized and validated thoughts. Provided empathic statements. Assigned homework. Assessed for suicidal and homicidal ideation.   Homework: Consider asking friends about people who are single and look at dating sites  Next Session: Review homework and emotional support  Diagnosis: F32.0 major depressive affective disorder, single episode, mild   Plan:   Goals Alleviate depressive symptoms Recognize, accept, and cope with depressive feelings Develop healthy thinking patterns Develop healthy interpersonal relationships  Objectives target date for all objectives is 07/06/2023 Cooperate with a medication evaluation by a physician Verbalize an accurate understanding of depression Verbalize an understanding of the treatment Identify and replace thoughts that support depression Learn and implement behavioral strategies Verbalize an understanding and resolution of current interpersonal problems Learn and implement problem solving and decision making skills Learn and implement conflict resolution skills to resolve interpersonal problems Verbalize an understanding of healthy and unhealthy emotions verbalize insight into how past relationships may be influence current experiences with depression Use mindfulness and acceptance strategies and increase value based behavior  Increase hopeful statements about the future.  Interventions Consistent with treatment model, discuss how change in cognitive, behavioral, and interpersonal can  help client alleviate depression CBT Behavioral activation help the client explore the relationship, nature of the dispute,  Help  the client develop new interpersonal skills and relationships Conduct Problem so living therapy Teach conflict resolution skills Use a process-experiential approach Conduct TLDP Conduct ACT Evaluate need for psychotropic medication Monitor adherence to medication   The patient and clinician reviewed the treatment plan on 08/09/2022. The patient approved of the treatment plan.   Ryan Corrigan, PsyD

## 2022-08-16 ENCOUNTER — Ambulatory Visit: Payer: PPO | Admitting: Family Medicine

## 2022-08-17 NOTE — Telephone Encounter (Signed)
Sure - would be Brilinta 60 mg PO BID - #90 tab, 3 refill  DH

## 2022-08-20 MED ORDER — TICAGRELOR 60 MG PO TABS: 60.0000 mg | ORAL_TABLET | Freq: Two times a day (BID) | ORAL | 3 refills | Status: DC

## 2022-08-29 ENCOUNTER — Ambulatory Visit (INDEPENDENT_AMBULATORY_CARE_PROVIDER_SITE_OTHER): Payer: PPO | Admitting: Psychologist

## 2022-08-29 DIAGNOSIS — F320 Major depressive disorder, single episode, mild: Secondary | ICD-10-CM | POA: Diagnosis not present

## 2022-08-29 NOTE — Progress Notes (Signed)
[facility] Counselor/Therapist Progress Note  Patient ID: Ryan [name], MRN: [id number],    Date: 08/29/2022  Time Spent: 09:01 am to 09:41 am; total time: 40 minutes  This session was held via video webex teletherapy due to the coronavirus risk at this time. The patient consented to video teletherapy and was located at his home during this session. He is aware it is the responsibility of the patient to secure confidentiality on his end of the session. The provider was in a private home office for the duration of this session. Limits of confidentiality were discussed with the patient.   Treatment Type: Individual Therapy  Reported Symptoms: Depression  Mental Status Exam: Appearance:  Well Groomed     Behavior: Appropriate  Motor: Normal  Speech/Language:  Clear and Coherent  Affect: Appropriate  Mood: normal  Thought process: normal  Thought content:   WNL  Sensory/Perceptual disturbances:   WNL  Orientation: oriented to person, place, and time/date  Attention: Good  Concentration: Good  Memory: WNL  Fund of knowledge:  Good  Insight:   Fair  Judgment:  Good  Impulse Control: Good   Risk Assessment: Danger to Self:  No Self-injurious Behavior: No Danger to Others: No Duty to Warn:no Physical Aggression / Violence:No  Access to Firearms a concern: No  Gang Involvement:No   Subjective: Beginning the session, patient described himself as doing well indicating that therapy has really helped him. Continuing to talk, he voiced that he wanted to discuss unexpressed emotions related to his ex-wife. He reflected on ways to express these emotions in a way that is safe for him. He then reflected on changes that he is experiencing due to having a heart attack. Patient was open to the podcast episode about change. He was agreeable to homework and following up. He denied suicidal and homicidal ideation.    Interventions:  Worked on developing a therapeutic  relationship with the patient using active listening and reflective statements. Provided emotional support using empathy and validation. Reviewed the treatment plan with the patient. Reviewed events since the intake. Praised the patient for doing better and explored what has assisted the patient. Normalized and validated expressed thoughts and emotions. Identified goals for the session. Processed thoughts and emotions related to ex-wife. Validated some of the frustrations. Explored different ways that patient could externalize those emotions and thoughts. Processed the idea of writing a letter expressing thoughts and emotions. Assisted in problem solving. Used socratic questions to assist the patient. Provided psychoeducation about how the heart is damaged following a heart attack. Identified the theme of change and began to explore this theme. Provided psychoeducation about podcast episode on change. Processed thoughts and emotions. Provided empathic statements. Assigned homework. Assessed for suicidal and homicidal ideation.   Homework: Write letter externalizing thoughts and emotions related to ex-wife; listen to podcast episode on change  Next Session: Review homework and emotional support  Diagnosis: F32.0 major depressive affective disorder, single episode, mild   Plan:   Goals Alleviate depressive symptoms Recognize, accept, and cope with depressive feelings Develop healthy thinking patterns Develop healthy interpersonal relationships  Objectives target date for all objectives is 07/06/2023 Cooperate with a medication evaluation by a physician Verbalize an accurate understanding of depression Verbalize an understanding of the treatment Identify and replace thoughts that support depression Learn and implement behavioral strategies Verbalize an understanding and resolution of current interpersonal problems Learn and implement problem solving and decision making skills Learn and implement  conflict resolution skills to resolve  interpersonal problems Verbalize an understanding of healthy and unhealthy emotions verbalize insight into how past relationships may be influence current experiences with depression Use mindfulness and acceptance strategies and increase value based behavior  Increase hopeful statements about the future.  Interventions Consistent with treatment model, discuss how change in cognitive, behavioral, and interpersonal can help client alleviate depression CBT Behavioral activation help the client explore the relationship, nature of the dispute,  Help the client develop new interpersonal skills and relationships Conduct Problem so living therapy Teach conflict resolution skills Use a process-experiential approach Conduct TLDP Conduct ACT Evaluate need for psychotropic medication Monitor adherence to medication   The patient and clinician reviewed the treatment plan on 08/09/2022. The patient approved of the treatment plan.   Hilbert Corrigan, PsyD

## 2022-09-12 ENCOUNTER — Ambulatory Visit: Payer: PPO | Admitting: Dietician

## 2022-09-12 DIAGNOSIS — G4733 Obstructive sleep apnea (adult) (pediatric): Secondary | ICD-10-CM | POA: Insufficient documentation

## 2022-09-18 DIAGNOSIS — Z961 Presence of intraocular lens: Secondary | ICD-10-CM | POA: Insufficient documentation

## 2022-09-25 DIAGNOSIS — Z789 Other specified health status: Secondary | ICD-10-CM | POA: Insufficient documentation

## 2022-09-27 ENCOUNTER — Ambulatory Visit: Payer: PPO | Admitting: Psychologist

## 2022-09-27 ENCOUNTER — Ambulatory Visit (INDEPENDENT_AMBULATORY_CARE_PROVIDER_SITE_OTHER): Payer: PPO | Admitting: Psychologist

## 2022-09-27 DIAGNOSIS — F320 Major depressive disorder, single episode, mild: Secondary | ICD-10-CM

## 2022-09-27 NOTE — Progress Notes (Signed)
[facility] Counselor/Therapist Progress Note  Patient ID: Ryan [name], MRN: [id number],    Date: 09/27/2022  Time Spent: 09:00 am to 09:45 am; total time: 45 minutes  This session was held via video webex teletherapy due to the coronavirus risk at this time. The patient consented to video teletherapy and was located at his home during this session. He is aware it is the responsibility of the patient to secure confidentiality on his end of the session. The provider was in a private home office for the duration of this session. Limits of confidentiality were discussed with the patient.   Treatment Type: Individual Therapy  Reported Symptoms: Lack of engaging in social activities  Mental Status Exam: Appearance:  Well Groomed     Behavior: Appropriate  Motor: Normal  Speech/Language:  Clear and Coherent  Affect: Appropriate  Mood: normal  Thought process: normal  Thought content:   WNL  Sensory/Perceptual disturbances:   WNL  Orientation: oriented to person, place, and time/date  Attention: Good  Concentration: Good  Memory: WNL  Fund of knowledge:  Good  Insight:   Fair  Judgment:  Good  Impulse Control: Good   Risk Assessment: Danger to Self:  No Self-injurious Behavior: No Danger to Others: No Duty to Warn:no Physical Aggression / Violence:No  Access to Firearms a concern: No  Gang Involvement:No   Subjective: Beginning the session, patient described himself as doing okay while reviewing events since the last session. He reflected on the holidays and then disclosed that he has had a difficult time recently managing diabetes resulting in high blood pressure. From there, he voiced that he wanted to increase his social engagements. He acknowledged he has already begun to do that. Patient explored the idea of establishing more consistent contact with his sister Jackelyn Poling. He ended the session, reflecting on an individual who he does not want to engage with  socially due to previous negative transgressions. He was agreeable to homework and following up. He denied suicidal and homicidal ideation.    Interventions:  Worked on developing a therapeutic relationship with the patient using active listening and reflective statements. Provided emotional support using empathy and validation. Reviewed the treatment plan with the patient. Reviewed how the holidays went for the patient. Praised the patient for exploring and addressing relationship concerns with Vaughan Basta. Processed concerns related to physical ailment. Identified goals for the session. Reviewed what patient wants to address in therapy and explored what patient has already begun to do as a  result of therapy. Used socratic questions to assist the patient. Briefly processed relationship dynamic with his sisters. Explored how often patient wants to interact with his sister Jackelyn Poling. Assisted in problem solving. Explored ways that patient could have a relationship with sister. Briefly challenged patient's perception of relationship with an individual who patient experienced a negative transgression with in the past. Provided empathic statements. Assigned homework. Assessed for suicidal and homicidal ideation.   Homework: Discuss with sister how they can engage with each other more consistently  Next Session: Review homework and emotional support  Diagnosis: F32.0 major depressive affective disorder, single episode, mild   Plan:   Goals Alleviate depressive symptoms Recognize, accept, and cope with depressive feelings Develop healthy thinking patterns Develop healthy interpersonal relationships  Objectives target date for all objectives is 07/06/2023 Cooperate with a medication evaluation by a physician Verbalize an accurate understanding of depression Verbalize an understanding of the treatment Identify and replace thoughts that support depression Learn and implement behavioral strategies  Verbalize an  understanding and resolution of current interpersonal problems Learn and implement problem solving and decision making skills Learn and implement conflict resolution skills to resolve interpersonal problems Verbalize an understanding of healthy and unhealthy emotions verbalize insight into how past relationships may be influence current experiences with depression Use mindfulness and acceptance strategies and increase value based behavior  Increase hopeful statements about the future.  Interventions Consistent with treatment model, discuss how change in cognitive, behavioral, and interpersonal can help client alleviate depression CBT Behavioral activation help the client explore the relationship, nature of the dispute,  Help the client develop new interpersonal skills and relationships Conduct Problem so living therapy Teach conflict resolution skills Use a process-experiential approach Conduct TLDP Conduct ACT Evaluate need for psychotropic medication Monitor adherence to medication   The patient and clinician reviewed the treatment plan on 08/09/2022. The patient approved of the treatment plan.   Conception Chancy, PsyD

## 2022-10-11 ENCOUNTER — Ambulatory Visit: Payer: PPO | Admitting: Psychologist

## 2022-11-01 ENCOUNTER — Other Ambulatory Visit: Payer: Self-pay | Admitting: Family Medicine

## 2022-11-01 NOTE — Telephone Encounter (Signed)
Lvm for patient to call back to schedule a 6 month follow up appointment that's past due with Dr. Zigmund Daniel. tvt

## 2022-11-01 NOTE — Telephone Encounter (Signed)
Please contact the patient to schedule 57-monthfollow-up. Sending 30 day med refill. Thanks

## 2022-11-02 ENCOUNTER — Telehealth: Payer: Self-pay

## 2022-11-02 NOTE — Telephone Encounter (Signed)
Patient lvm stating he was establishing with a new PCP.

## 2022-11-05 NOTE — Telephone Encounter (Signed)
Pt stated that he wasn't happy with the care of Dr. West Pugh and was changing his care somewhere else. I asked if he wanted to change his care to another doctor here at [facility] and he stated NO loudly. tvt

## 2022-11-19 NOTE — Progress Notes (Signed)
Pt informed of providers recommendations. He does not take ASA. Pt states that he has started Jardiance and does not know if he wants to change. Informed pt that while he is taking the Brilinta he is taking too much medication and at higher risk for bleed. Pt verbalized understanding. He states that he would "like to think about this and call back with what he decides. Coletta Memos, FNP-C notified. We will await call back from pt.

## 2022-11-22 DIAGNOSIS — M170 Bilateral primary osteoarthritis of knee: Secondary | ICD-10-CM | POA: Insufficient documentation

## 2023-01-01 DIAGNOSIS — E1142 Type 2 diabetes mellitus with diabetic polyneuropathy: Secondary | ICD-10-CM | POA: Insufficient documentation

## 2023-04-09 NOTE — Patient Instructions (Incomplete)

## 2023-04-09 NOTE — Progress Notes (Unsigned)
PATIENT: Ryan [name] DOB: [birth year]  REASON FOR VISIT: follow up HISTORY FROM: patient  No chief complaint on file.   HISTORY OF PRESENT ILLNESS:  04/09/23 ALL:  Ryan [name] returns for follow up for OSA on CPAP.     04/04/2022 ALL: Ryan [name] returns for follow up for OSA on CPAP. He continues to do well. He is using CPAP nightly for about 7 hours. He reports his old nasal mask was discontinued and he is having a harder time adjusting to the new mask and head gear. He feels he is doing better, now. He orders supplies online. He has used CPAP over 22 years. Current machine is 71 year old.     03/07/2021 ALL:  Ryan [name] is a 72 y.o. male here today for follow up for OSA on CPAP. He was diagnosed with OSA in 2020 and has used CPAP since. He recently received a new CPAP machine. HST confirmed mild OSA with AHI of 17.3/hr. he is doing very well with his new machine. He did have some difficulty with DME communication. He did not receive a mask when CPAP was set up. He has had a difficult time but has recently received a new mask and headgear. He is currently using a nasal mask.       REVIEW OF SYSTEMS: Out of a complete 14 system review of symptoms, the patient complains only of the following symptoms, none and all other reviewed systems are negative.  ESS: 5/24, previously 3/24   ALLERGIES: No Known Allergies  HOME MEDICATIONS: Outpatient Medications Prior to Visit  Medication Sig Dispense Refill   cetirizine (ZYRTEC) 10 MG tablet Take by mouth.     glipiZIDE (GLUCOTROL XL) 10 MG 24 hr tablet Take 1 tablet (10 mg total) by mouth daily. 30 tablet 0   nitroGLYCERIN (NITROSTAT) 0.4 MG SL tablet SMARTSIG:1 Tablet(s) Sublingual PRN     omega-3 acid ethyl esters (LOVAZA) 1 g capsule Take 2 capsules (2 g total) by mouth 2 (two) times daily. 360 capsule 1   ticagrelor (BRILINTA) 60 MG TABS tablet Take 1 tablet (60 mg total) by mouth 2 (two) times daily. 180 tablet 3    valACYclovir (VALTREX) 1000 MG tablet Take 1 tablet (1,000 mg total) by mouth daily. 90 tablet 3   valsartan (DIOVAN) 40 MG tablet TAKE ONE TABLET BY MOUTH EVERY DAY 90 tablet 3   No facility-administered medications prior to visit.    PAST MEDICAL HISTORY: Past Medical History:  Diagnosis Date   Cervical spine arthritis 2007   C6-7 hemidiscectomy   Coronary artery disease 04/02/2020   Cardiac cath on 04/04/2020 for non-STEMI: mLAD ~70% & small-mod D1 60-65%, LCx CTO with L-L & R-L collateral filling OM (unable to cross); Large Dom RCA (ectatic with diffuse mild irregularties) --< RPDA & PRAV-PL mild-mod irregulatities. EF 50-55%, anterolateral-apical HK. 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   DJD (degenerative joint disease) of knee    Bilateral knees.   Genital herpes    Has standing dose of Valtrex   Hyperlipidemia    Hypertension    OSA on CPAP 2000   Pulmonary nodules 03/2020   CTA-PE protocol (04/02/2020): No PE.  Lung nodules measuring up to 3 mm noted.   Sleep apnea     PAST SURGICAL HISTORY: Past Surgical History:  Procedure Laterality Date  CERVICAL SPINE SURGERY  2007   C6-7 hemidiscectomy   LEFT HEART CATH AND CORONARY ANGIOGRAPHY  04/04/2020   Lucrezia Starch, MD; [facility]: mLAD ~70% & (small-moderate) D1 60 to 70%; nondominant LCx mid vessel CTO prior to OM (OM fills via L-L and R-L collaterals); large-dominant RCA with mild ectasia and diffuse irregularities.  Bifurcates into RPDA and RPL V-PL with mild to moderate irregularities.  EF 50 to 55%.  Anterolateral-apical HK.  LVEDP 10 mmHg.   TRANSTHORACIC ECHOCARDIOGRAM  04/03/2020    [facility]) normal LV size and thickness.  EF 55 to 60%.  Possible mid apical lateral HK.  Not well visualized RV.  Otherwise normal  valves.    FAMILY HISTORY: Family History  Problem Relation Age of Onset   Diabetes Father    Diabetes Other        Several people in the family have diabetes, but no noted CAD.    SOCIAL HISTORY: Social History   Socioeconomic History   Marital status: Divorced    Spouse name: Not on file   Number of children: Not on file   Years of education: Not on file   Highest education level: Not on file  Occupational History   Occupation: Retired  Tobacco Use   Smoking status: Never   Smokeless tobacco: Never  Vaping Use   Vaping status: Never Used  Substance and Sexual Activity   Alcohol use: Yes    Alcohol/week: 3.0 standard drinks of alcohol    Types: 3 Standard drinks or equivalent per week    Comment: Occasional social   Drug use: Never   Sexual activity: Yes    Partners: Female  Other Topics Concern   Not on file  Social History Narrative   He is currently completing a long very stressful and somewhat mean-spirited divorce.  Has been under a lot of stress with transition of property etc.  He is just wanting to make a full clean break.      Prior to his MI, he was very active walking 30 minutes most days of the week.   Social Determinants of Health   Financial Resource Strain: Low Risk  (12/16/2022)   Received from [facility]   Overall Financial Resource Strain (CARDIA)    Difficulty of Paying Living Expenses: Not hard at all  Food Insecurity: No Food Insecurity (12/16/2022)   Received from [facility], [facility]   Hunger Vital Sign    Worried About Running Out of Food in the Last Year: Never true    Ran Out of Food in the Last Year: Never true  Transportation Needs: No Transportation Needs (12/16/2022)   Received from [facility]   PRAPARE - Transportation    Lack of Transportation (Medical): No    Lack of Transportation (Non-Medical): No  Physical Activity: Insufficiently Active (12/16/2022)   Received from [facility]   Exercise Vital Sign    Days of Exercise per Week: 2 days    Minutes of Exercise per Session: 20 min  Stress: Stress Concern Present (12/16/2022)   Received from [facility] Occup[facility] - Occupational Stress Questionnaire    Feeling of Stress : To some extent  Social Connections: Somewhat Isolated (12/16/2022)   Received from [facility]   Social Network    How would you rate your social network (family, work, friends)?: Restricted participation with  some degree of social isolation  Intimate Partner Violence: Not At Risk (12/16/2022)   Received from [facility], [facility]   HITS    Over the last 12 months how often did your partner physically hurt you?: 1    Over the last 12 months how often did your partner insult you or talk down to you?: 1    Over the last 12 months how often did your partner threaten you with physical harm?: 1    Over the last 12 months how often did your partner scream or curse at you?: 1     PHYSICAL EXAM  There were no vitals filed for this visit.   There is no height or weight on file to calculate BMI.  Generalized: Well developed, in no acute distress  Cardiology: normal rate and rhythm, no murmur noted Respiratory: clear to auscultation bilaterally  Neurological examination  Mentation: Alert oriented to time, place, history taking. Follows all commands speech and language fluent Cranial nerve II-XII: Pupils were equal round reactive to light. Extraocular movements were full, visual field were full  Motor: The motor testing reveals 5 over 5 strength of all 4 extremities. Good symmetric motor tone is noted throughout.  Gait and station: Gait is normal.    DIAGNOSTIC DATA (LABS, IMAGING, TESTING) - I reviewed patient records, labs, notes, testing and imaging myself where available.      No data to display           Lab Results  Component Value Date   WBC 6.6  05/17/2022   HGB 16.7 05/17/2022   HCT 48.3 05/17/2022   MCV 98.4 05/17/2022   PLT 205 05/17/2022      Component Value Date/Time   NA 135 05/17/2022 0000   NA 138 07/14/2020 0858   K 4.2 05/17/2022 0000   CL 98 05/17/2022 0000   CO2 30 05/17/2022 0000   GLUCOSE 242 (H) 05/17/2022 0000   BUN 15 05/17/2022 0000   BUN 17 07/14/2020 0858   CREATININE 1.10 05/17/2022 0000   CALCIUM 9.2 05/17/2022 0000   PROT 7.4 05/17/2022 0000   PROT 7.4 07/14/2020 0858   ALBUMIN 4.6 07/14/2020 0858   AST 30 05/17/2022 0000   ALT 41 05/17/2022 0000   ALKPHOS 59 07/14/2020 0858   BILITOT 0.6 05/17/2022 0000   BILITOT 0.7 07/14/2020 0858   GFRNONAA 56 (L) 07/14/2020 0858   GFRAA 65 07/14/2020 0858   Lab Results  Component Value Date   CHOL 230 (H) 05/17/2022   HDL 31 (L) 05/17/2022   LDLCALC  05/17/2022     Comment:     . LDL cholesterol not calculated. Triglyceride levels greater than 400 mg/dL invalidate calculated LDL results. . Reference range: <100 . Desirable range <100 mg/dL for primary prevention;   <70 mg/dL for patients with CHD or diabetic patients  with > or = 2 CHD risk factors. Marland Kitchen LDL-C is now calculated using the Martin-Hopkins  calculation, which is a validated novel method providing  better accuracy than the Friedewald equation in the  estimation of LDL-C.  Horald Pollen et al. Lenox Ahr. 2956;213(08): 2061-2068  (http://education.[employer].com/faq/FAQ164)    LDLDIRECT 78 05/17/2022   TRIG 769 (H) 05/17/2022   CHOLHDL 7.4 (H) 05/17/2022   Lab Results  Component Value Date   HGBA1C 8.7 (A) 05/17/2022   No results found for: "VITAMINB12" Lab Results  Component Value Date   TSH 2.910 07/14/2020     ASSESSMENT AND PLAN 72 y.o. year old male  has a past medical history of Cervical spine arthritis (2007), Coronary artery disease (04/02/2020), COVID-19 virus infection (09/2019), Diabetes mellitus type 2 with complications, uncontrolled (03/2020), DJD (degenerative  joint disease) of knee, Genital herpes, Hyperlipidemia, Hypertension, OSA on CPAP (2000), Pulmonary nodules (03/2020), and Sleep apnea. here with   No diagnosis found.     Ryan [name] is doing well on CPAP therapy. Compliance report reveals excellent compliance. He was encouraged to continue using CPAP nightly and for greater than 4 hours each night. We will update supply orders as indicated. Risks of untreated sleep apnea review and education materials provided. Healthy lifestyle habits encouraged. He will follow up in 1 year, sooner if needed. He verbalizes understanding and agreement with this plan.    No orders of the defined types were placed in this encounter.     No orders of the defined types were placed in this encounter.      Shawnie Dapper, FNP-C 04/09/2023, 2:36 PM [facility] [address], [address] [place], [place] [address] [phone]

## 2023-04-10 ENCOUNTER — Ambulatory Visit: Payer: PPO | Admitting: Family Medicine

## 2023-04-10 ENCOUNTER — Encounter: Payer: Self-pay | Admitting: Family Medicine

## 2023-04-10 VITALS — BP 168/89 | HR 77 | Ht 73.0 in | Wt 241.0 lb

## 2023-04-10 DIAGNOSIS — G4733 Obstructive sleep apnea (adult) (pediatric): Secondary | ICD-10-CM

## 2023-04-30 DIAGNOSIS — I10 Essential (primary) hypertension: Secondary | ICD-10-CM | POA: Insufficient documentation

## 2023-06-19 DIAGNOSIS — E1165 Type 2 diabetes mellitus with hyperglycemia: Secondary | ICD-10-CM | POA: Insufficient documentation

## 2023-07-07 ENCOUNTER — Other Ambulatory Visit: Payer: Self-pay | Admitting: Family Medicine

## 2023-07-15 ENCOUNTER — Ambulatory Visit: Payer: PPO | Admitting: General Practice

## 2023-10-21 DIAGNOSIS — D490 Neoplasm of unspecified behavior of digestive system: Secondary | ICD-10-CM | POA: Insufficient documentation

## 2023-11-01 DIAGNOSIS — C040 Malignant neoplasm of anterior floor of mouth: Secondary | ICD-10-CM | POA: Insufficient documentation

## 2023-11-06 DIAGNOSIS — C069 Malignant neoplasm of mouth, unspecified: Secondary | ICD-10-CM | POA: Insufficient documentation

## 2023-11-19 DIAGNOSIS — C049 Malignant neoplasm of floor of mouth, unspecified: Secondary | ICD-10-CM | POA: Insufficient documentation

## 2023-12-03 DIAGNOSIS — Z978 Presence of other specified devices: Secondary | ICD-10-CM | POA: Insufficient documentation

## 2024-02-17 DIAGNOSIS — Z510 Encounter for antineoplastic radiation therapy: Secondary | ICD-10-CM | POA: Insufficient documentation

## 2024-02-19 DIAGNOSIS — A6000 Herpesviral infection of urogenital system, unspecified: Secondary | ICD-10-CM | POA: Insufficient documentation

## 2024-03-11 DIAGNOSIS — E860 Dehydration: Secondary | ICD-10-CM | POA: Insufficient documentation

## 2024-03-11 DIAGNOSIS — T451X5A Adverse effect of antineoplastic and immunosuppressive drugs, initial encounter: Secondary | ICD-10-CM | POA: Insufficient documentation

## 2024-04-06 ENCOUNTER — Telehealth: Payer: Self-pay | Admitting: Family Medicine

## 2024-04-06 NOTE — Telephone Encounter (Signed)
 I spoke w/ Ryan [name] further. She did inform pt that telephone visits are not covered by insurance.   Amy- how do you want to handle this? Pt currently scheduled for visit with you on 04/09/24

## 2024-04-06 NOTE — Telephone Encounter (Signed)
 Pt called stating that his computer does not have a camera and due to his cancer treatments he is receiving he is not able to travel therefore can not come in to the office. Pt would like to know what can be advised to him.

## 2024-04-06 NOTE — Telephone Encounter (Signed)
 Called pt back. He recently had teeth/part of jaw removed and under going chemo/radiation for cancer treatment. Tolerating CPAP well and has needed supplies currently. Requesting to cx appt and r/s to October. Scheduled appt for 06/30/24 at 1:30pm with AL,NP.

## 2024-04-09 ENCOUNTER — Telehealth: Payer: PPO | Admitting: Family Medicine

## 2024-05-07 ENCOUNTER — Encounter: Payer: Self-pay | Admitting: Family Medicine

## 2024-05-12 ENCOUNTER — Encounter: Payer: Self-pay | Admitting: Sports Medicine

## 2024-06-29 NOTE — Progress Notes (Unsigned)
 SABRA

## 2024-06-29 NOTE — Patient Instructions (Signed)

## 2024-06-29 NOTE — Progress Notes (Unsigned)
 PATIENT: Ryan [name] DOB: [birth year]  REASON FOR VISIT: follow up HISTORY FROM: patient  No chief complaint on file.   HISTORY OF PRESENT ILLNESS:  06/29/24 ALL:  Ryan [name] returns for follow up for OSA on CPAP.     04/10/2023 ALL:  Ryan [name] returns for follow up for OSA on CPAP. He is doing very well. He denies concerns with machine or supplies. He is using machine nightly for 7-8 hours. He orders supplies online. Using a discontinued mask but has bought extra online.     04/04/2022 ALL: Ryan [name] returns for follow up for OSA on CPAP. He continues to do well. He is using CPAP nightly for about 7 hours. He reports his old nasal mask was discontinued and he is having a harder time adjusting to the new mask and head gear. He feels he is doing better, now. He orders supplies online. He has used CPAP over 22 years. Current machine is 72 year old.     03/07/2021 ALL:  Ryan [name] is a 73 y.o. male here today for follow up for OSA on CPAP. He was diagnosed with OSA in 2020 and has used CPAP since. He recently received a new CPAP machine. HST confirmed mild OSA with AHI of 17.3/hr. he is doing very well with his new machine. He did have some difficulty with DME communication. He did not receive a mask when CPAP was set up. He has had a difficult time but has recently received a new mask and headgear. He is currently using a nasal mask.       REVIEW OF SYSTEMS: Out of a complete 14 system review of symptoms, the patient complains only of the following symptoms, none and all other reviewed systems are negative.  ESS: 0   ALLERGIES: No Known Allergies  HOME MEDICATIONS: Outpatient Medications Prior to Visit  Medication Sig Dispense Refill   cetirizine (ZYRTEC) 10 MG tablet Take by mouth.     glipiZIDE  (GLUCOTROL  XL) 10 MG 24 hr tablet Take 1 tablet (10 mg total) by mouth daily. 30 tablet 0   nitroGLYCERIN (NITROSTAT) 0.4 MG SL tablet SMARTSIG:1 Tablet(s) Sublingual PRN  (Patient not taking: Reported on 04/10/2023)     omega-3 acid ethyl esters (LOVAZA ) 1 g capsule Take 2 capsules (2 g total) by mouth 2 (two) times daily. 360 capsule 1   ticagrelor  (BRILINTA ) 60 MG TABS tablet Take 1 tablet (60 mg total) by mouth 2 (two) times daily. 180 tablet 3   valACYclovir  (VALTREX ) 1000 MG tablet Take 1 tablet (1,000 mg total) by mouth daily. 90 tablet 3   valsartan  (DIOVAN ) 40 MG tablet TAKE ONE TABLET BY MOUTH EVERY DAY 90 tablet 3   No facility-administered medications prior to visit.    PAST MEDICAL HISTORY: Past Medical History:  Diagnosis Date   Cervical spine arthritis 2007   C6-7 hemidiscectomy   Coronary artery disease 04/02/2020   Cardiac cath on 04/04/2020 for non-STEMI: mLAD ~70% & small-mod D1 60-65%, LCx CTO with L-L & R-L collateral filling OM (unable to cross); Large Dom RCA (ectatic with diffuse mild irregularties) --< RPDA & PRAV-PL mild-mod irregulatities. EF 50-55%, anterolateral-apical HK. 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   DJD (degenerative joint disease) of knee    Bilateral knees.   Genital herpes    Has standing dose of Valtrex   Hyperlipidemia    Hypertension    OSA on CPAP 2000   Pulmonary nodules 03/2020   CTA-PE protocol (04/02/2020): No PE.  Lung nodules measuring up to 3 mm noted.   Sleep apnea     PAST SURGICAL HISTORY: Past Surgical History:  Procedure Laterality Date   CERVICAL SPINE SURGERY  2007   C6-7 hemidiscectomy   LEFT HEART CATH AND CORONARY ANGIOGRAPHY  04/04/2020   Norleen EMERSON Solar, MD; [facility]: mLAD ~70% & (small-moderate) D1 60 to 70%; nondominant LCx mid vessel CTO prior to OM (OM fills via L-L and R-L collaterals); large-dominant RCA with mild ectasia and diffuse irregularities.  Bifurcates into RPDA and RPL V-PL with mild to moderate  irregularities.  EF 50 to 55%.  Anterolateral-apical HK.  LVEDP 10 mmHg.   TRANSTHORACIC ECHOCARDIOGRAM  04/03/2020    [facility]) normal LV size and thickness.  EF 55 to 60%.  Possible mid apical lateral HK.  Not well visualized RV.  Otherwise normal valves.    FAMILY HISTORY: Family History  Problem Relation Age of Onset   Diabetes Father    Diabetes Other        Several people in the family have diabetes, but no noted CAD.    SOCIAL HISTORY: Social History   Socioeconomic History   Marital status: Divorced    Spouse name: Not on file   Number of children: Not on file   Years of education: Not on file   Highest education level: Not on file  Occupational History   Occupation: Retired  Tobacco Use   Smoking status: Never   Smokeless tobacco: Never  Vaping Use   Vaping status: Never Used  Substance and Sexual Activity   Alcohol use: Yes    Alcohol/week: 3.0 standard drinks of alcohol    Types: 3 Standard drinks or equivalent per week    Comment: Occasional social   Drug use: Never   Sexual activity: Yes    Partners: Female  Other Topics Concern   Not on file  Social History Narrative   Right Handed   No Caffeine Use   Social Drivers of Health   Financial Resource Strain: Low Risk  (12/16/2022)   Received from [facility]   Overall Financial Resource Strain (CARDIA)    Difficulty of Paying Living Expenses: Not hard at all  Food Insecurity: Low Risk  (11/20/2023)   Received from [facility]   Hunger Vital Sign    Within the past 12 months, you worried that your food would run out before you got money to buy more: Never true    Within the past 12 months, the food you bought just didn't last and you didn't have money to get more. : Never true  Transportation Needs: Unmet Transportation Needs (11/20/2023)   Received from [facility]    In the past 12 months, has lack of reliable transportation kept you from  medical appointments, meetings, work or from getting things needed for daily living? : Yes  Physical Activity: Insufficiently Active (12/16/2022)   Received from [facility]   Exercise Vital Sign    On average, how many days per week do you engage in moderate to strenuous exercise (like a brisk walk)?: 2 days    On average, how many minutes do you engage in exercise at this level?: 20 min  Stress: Stress Concern Present (12/16/2022)   Received from [facility] of  Occupational Health - Occupational Stress Questionnaire    Feeling of Stress : To some extent  Social Connections: Somewhat Isolated (12/16/2022)   Received from [facility]   Social Network    How would you rate your social network (family, work, friends)?: Restricted participation with some degree of social isolation  Intimate Partner Violence: Not At Risk (12/16/2022)   Received from [facility]   HITS    Over the last 12 months how often did your partner physically hurt you?: Never    Over the last 12 months how often did your partner insult you or talk down to you?: Never    Over the last 12 months how often did your partner threaten you with physical harm?: Never    Over the last 12 months how often did your partner scream or curse at you?: Never     PHYSICAL EXAM  There were no vitals filed for this visit.    There is no height or weight on file to calculate BMI.  Generalized: Well developed, in no acute distress  Cardiology: normal rate and rhythm, no murmur noted Respiratory: clear to auscultation bilaterally  Neurological examination  Mentation: Alert oriented to time, place, history taking. Follows all commands speech and language fluent Cranial nerve II-XII: Pupils were equal round reactive to light. Extraocular movements were full, visual field were full  Motor: The motor testing reveals 5 over 5 strength of all 4 extremities. Good symmetric motor tone is noted throughout.  Gait and  station: Gait is normal.    DIAGNOSTIC DATA (LABS, IMAGING, TESTING) - I reviewed patient records, labs, notes, testing and imaging myself where available.      No data to display           Lab Results  Component Value Date   WBC 6.6 05/17/2022   HGB 16.7 05/17/2022   HCT 48.3 05/17/2022   MCV 98.4 05/17/2022   PLT 205 05/17/2022      Component Value Date/Time   NA 135 05/17/2022 0000   NA 138 07/14/2020 0858   K 4.2 05/17/2022 0000   CL 98 05/17/2022 0000   CO2 30 05/17/2022 0000   GLUCOSE 242 (H) 05/17/2022 0000   BUN 15 05/17/2022 0000   BUN 17 07/14/2020 0858   CREATININE 1.10 05/17/2022 0000   CALCIUM 9.2 05/17/2022 0000   PROT 7.4 05/17/2022 0000   PROT 7.4 07/14/2020 0858   ALBUMIN 4.6 07/14/2020 0858   AST 30 05/17/2022 0000   ALT 41 05/17/2022 0000   ALKPHOS 59 07/14/2020 0858   BILITOT 0.6 05/17/2022 0000   BILITOT 0.7 07/14/2020 0858   GFRNONAA 56 (L) 07/14/2020 0858   GFRAA 65 07/14/2020 0858   Lab Results  Component Value Date   CHOL 230 (H) 05/17/2022   HDL 31 (L) 05/17/2022   LDLCALC  05/17/2022     Comment:     . LDL cholesterol not calculated. Triglyceride levels greater than 400 mg/dL invalidate calculated LDL results. . Reference range: <100 . Desirable range <100 mg/dL for primary prevention;   <70 mg/dL for patients with CHD or diabetic patients  with > or = 2 CHD risk factors. SABRA LDL-C is now calculated using the Martin-Hopkins  calculation, which is a validated novel method providing  better accuracy than the Friedewald equation in the  estimation of LDL-C.  Gladis APPLETHWAITE et al. SANDREA. 7986;689(80): 2061-2068  (http://education.[facility].com/faq/FAQ164)    LDLDIRECT 78 05/17/2022   TRIG 769 (H) 05/17/2022   CHOLHDL  7.4 (H) 05/17/2022   Lab Results  Component Value Date   HGBA1C 8.7 (A) 05/17/2022   No results found for: [id number] Lab Results  Component Value Date   TSH 2.910 07/14/2020     ASSESSMENT AND  PLAN 73 y.o. year old male  has a past medical history of Cervical spine arthritis (2007), Coronary artery disease (04/02/2020), COVID-19 virus infection (09/2019), Diabetes mellitus type 2 with complications, uncontrolled (03/2020), DJD (degenerative joint disease) of knee, Genital herpes, Hyperlipidemia, Hypertension, OSA on CPAP (2000), Pulmonary nodules (03/2020), and Sleep apnea. here with   No diagnosis found.   Maureen D Nelles is doing well on CPAP therapy. Compliance report reveals excellent compliance. He was encouraged to continue using CPAP nightly and for greater than 4 hours each night. Currently ordering supplies online. Risks of untreated sleep apnea review and education materials provided. Healthy lifestyle habits encouraged. He will follow up in 1 year, sooner if needed. He verbalizes understanding and agreement with this plan.    No orders of the defined types were placed in this encounter.     No orders of the defined types were placed in this encounter.      Greig Forbes, FNP-C 06/29/2024, 8:10 AM [facility] [address], [address] [place], [place] [address] [phone]

## 2024-06-30 ENCOUNTER — Ambulatory Visit: Admitting: Family Medicine

## 2024-06-30 ENCOUNTER — Encounter: Payer: Self-pay | Admitting: Family Medicine

## 2024-06-30 VITALS — BP 154/85 | HR 48 | Ht 73.0 in | Wt 215.5 lb

## 2024-06-30 DIAGNOSIS — G4733 Obstructive sleep apnea (adult) (pediatric): Secondary | ICD-10-CM

## 2025-07-12 ENCOUNTER — Telehealth: Admitting: Family Medicine
